# Patient Record
Sex: Female | Born: 1985 | Race: Black or African American | Hispanic: No | Marital: Single | Smoking: Current some day smoker
Health system: Southern US, Community
[De-identification: ages and names within clinical notes are randomized; demographics above are authoritative.]

## PROBLEM LIST (undated history)

## (undated) DIAGNOSIS — L309 Dermatitis, unspecified: Secondary | ICD-10-CM

## (undated) DIAGNOSIS — E7800 Pure hypercholesterolemia, unspecified: Secondary | ICD-10-CM

## (undated) DIAGNOSIS — G43909 Migraine, unspecified, not intractable, without status migrainosus: Secondary | ICD-10-CM

## (undated) DIAGNOSIS — J45909 Unspecified asthma, uncomplicated: Secondary | ICD-10-CM

## (undated) DIAGNOSIS — K219 Gastro-esophageal reflux disease without esophagitis: Secondary | ICD-10-CM

## (undated) DIAGNOSIS — F419 Anxiety disorder, unspecified: Secondary | ICD-10-CM

## (undated) DIAGNOSIS — K449 Diaphragmatic hernia without obstruction or gangrene: Secondary | ICD-10-CM

## (undated) HISTORY — PX: CHOLECYSTECTOMY: SHX55

## (undated) HISTORY — PX: ABDOMINAL HYSTERECTOMY: SHX81

## (undated) HISTORY — PX: TONSILLECTOMY: SUR1361

---

## 2012-06-20 ENCOUNTER — Encounter (HOSPITAL_BASED_OUTPATIENT_CLINIC_OR_DEPARTMENT_OTHER): Payer: Self-pay | Admitting: *Deleted

## 2012-06-20 ENCOUNTER — Emergency Department (HOSPITAL_BASED_OUTPATIENT_CLINIC_OR_DEPARTMENT_OTHER)
Admission: EM | Admit: 2012-06-20 | Discharge: 2012-06-20 | Disposition: A | Discharge status: Home / Self Care | Payer: Medicaid Other | Attending: Emergency Medicine | Admitting: Emergency Medicine

## 2012-06-20 DIAGNOSIS — A499 Bacterial infection, unspecified: Secondary | ICD-10-CM | POA: Insufficient documentation

## 2012-06-20 DIAGNOSIS — L309 Dermatitis, unspecified: Secondary | ICD-10-CM

## 2012-06-20 DIAGNOSIS — N760 Acute vaginitis: Secondary | ICD-10-CM

## 2012-06-20 DIAGNOSIS — L259 Unspecified contact dermatitis, unspecified cause: Secondary | ICD-10-CM | POA: Insufficient documentation

## 2012-06-20 DIAGNOSIS — B9689 Other specified bacterial agents as the cause of diseases classified elsewhere: Secondary | ICD-10-CM | POA: Insufficient documentation

## 2012-06-20 HISTORY — DX: Dermatitis, unspecified: L30.9

## 2012-06-20 LAB — WET PREP, GENITAL: Trich, Wet Prep: NONE SEEN

## 2012-06-20 LAB — URINALYSIS, ROUTINE W REFLEX MICROSCOPIC
Bilirubin Urine: NEGATIVE
Glucose, UA: NEGATIVE mg/dL
Hgb urine dipstick: NEGATIVE
Ketones, ur: NEGATIVE mg/dL
Protein, ur: NEGATIVE mg/dL
pH: 7 (ref 5.0–8.0)

## 2012-06-20 MED ORDER — DEXAMETHASONE SODIUM PHOSPHATE 4 MG/ML IJ SOLN
4.0000 mg | Freq: Once | INTRAMUSCULAR | Status: AC
  Administered 2012-06-20: 4 mg via INTRAMUSCULAR
  Filled 2012-06-20: qty 1

## 2012-06-20 MED ORDER — METRONIDAZOLE 500 MG PO TABS
500.0000 mg | ORAL_TABLET | Freq: Once | ORAL | Status: AC
  Administered 2012-06-20: 500 mg via ORAL
  Filled 2012-06-20: qty 1

## 2012-06-20 MED ORDER — METRONIDAZOLE 500 MG PO TABS: 500.0000 mg | ORAL_TABLET | Freq: Two times a day (BID) | ORAL | Status: AC

## 2012-06-20 MED ORDER — HYDROXYZINE HCL 25 MG PO TABS
25.0000 mg | ORAL_TABLET | Freq: Once | ORAL | Status: AC
  Administered 2012-06-20: 25 mg via ORAL
  Filled 2012-06-20: qty 1

## 2012-06-20 NOTE — ED Provider Notes (Addendum)
I saw and evaluated the patient, reviewed the resident's note and I agree with the findings and plan.  RRR, CTAB. See Resident note for full pelvic exam.  Forbes Cellar, MD 06/20/12 1533  Forbes Cellar, MD 06/20/12 [phone]

## 2012-06-20 NOTE — ED Provider Notes (Signed)
History     CSN: [id number]  Arrival date & time 06/20/12  4098   None     Chief Complaint  Patient presents with  . Rash  . Vaginal Itching    (Consider location/radiation/quality/duration/timing/severity/associated sxs/prior treatment) HPI  26 year old female who presents with a severe itching over her entire body. She believes that it is the results of an eczema flare and claims that she usually has an injection of cortisone once a year from her dermatologist. She has not been able to see her dermatologist , because has has been out of town. She also notes that she has vaginal burning without discharge and is concerned about a yeast infection versus bacterial vaginosis.  She denies fever and abdominal pain.   Past Medical History  Diagnosis Date  . Eczema     History reviewed. No pertinent past surgical history.  History reviewed. No pertinent family history.  History  Substance Use Topics  . Smoking status: Never Smoker   . Smokeless tobacco: Not on file  . Alcohol Use: Yes     occ    OB History    Grav Para Term Preterm Abortions TAB SAB Ect Mult Living                  Review of Systems  Constitutional: Negative.   Eyes: Negative.   Respiratory: Negative.   Cardiovascular: Negative.   Gastrointestinal: Negative.   Genitourinary: Positive for dysuria and vaginal pain. Negative for hematuria, flank pain, vaginal discharge and pelvic pain.  Musculoskeletal: Negative.   Neurological: Positive for headaches.  Hematological: Negative.     Allergies  Review of patient's allergies indicates no known allergies.  Home Medications   Current Outpatient Rx  Name Route Sig Dispense Refill  . METRONIDAZOLE 500 MG PO TABS Oral Take 1 tablet (500 mg total) by mouth 2 (two) times daily. 14 tablet 0    BP 122/83  Pulse 89  Temp 98.8 F (37.1 C) (Oral)  SpO2 100%  Physical Exam  Constitutional: She is oriented to person, place, and time. She appears  well-developed and well-nourished. No distress.       Obese female persistently scratching her trunk and extremities throughout the exam   Eyes: Conjunctivae and EOM are normal. Pupils are equal, round, and reactive to light. No scleral icterus.  Fundoscopic exam:      The right eye shows no papilledema.       The left eye shows no papilledema.  Cardiovascular: Normal rate and regular rhythm.   Pulmonary/Chest: Effort normal and breath sounds normal.  Genitourinary: Vagina normal. There is no rash or tenderness on the right labia. There is no rash or tenderness on the left labia. Cervix exhibits no motion tenderness and no discharge.  Neurological: She is alert and oriented to person, place, and time. No cranial nerve deficit or sensory deficit.  Skin: No abrasion noted. She is not diaphoretic.       Dry skin on her extremities with numerous small pale papules in the flexor surfaces of her extremities in a generalized distribution     ED Course  Procedures (including critical care time)  Labs Reviewed  WET PREP, GENITAL - Abnormal; Notable for the following:    Clue Cells Wet Prep HPF POC TOO NUMEROUS TO COUNT (*)     WBC, Wet Prep HPF POC FEW (*)     All other components within normal limits  URINALYSIS, ROUTINE W REFLEX MICROSCOPIC  PREGNANCY, URINE  GC/CHLAMYDIA PROBE AMP, GENITAL   No results found.   1. Bacterial vaginosis   2. Eczema       MDM  Mrs. Tucciarone has generalized pruritis that is most likely from atopic dermatitis. Given the severity of itching and the previous relief from corticosteroids, she was given an IM injection of 4mg  of dexamethasone. For the bacterial vaginosis, she was prescribed metronidazole. Her headache is a primary tension type headache that did not require any intervention. The patient was stable for discharge and agreeable with this plan.          Garnetta Buddy, MD 06/20/12 [phone]

## 2012-06-20 NOTE — ED Notes (Signed)
Itching all over has history of eczema also reports using new soap that she thinks irritated "everything" also thinks she has yeast infection

## 2012-06-21 LAB — GC/CHLAMYDIA PROBE AMP, GENITAL: Chlamydia, DNA Probe: NEGATIVE

## 2013-01-05 ENCOUNTER — Encounter (HOSPITAL_BASED_OUTPATIENT_CLINIC_OR_DEPARTMENT_OTHER): Payer: Self-pay | Admitting: *Deleted

## 2013-01-05 ENCOUNTER — Emergency Department (HOSPITAL_BASED_OUTPATIENT_CLINIC_OR_DEPARTMENT_OTHER)
Admission: EM | Admit: 2013-01-05 | Discharge: 2013-01-05 | Disposition: A | Discharge status: Home / Self Care | Payer: Medicaid Other | Attending: Emergency Medicine | Admitting: Emergency Medicine

## 2013-01-05 DIAGNOSIS — R21 Rash and other nonspecific skin eruption: Secondary | ICD-10-CM | POA: Insufficient documentation

## 2013-01-05 DIAGNOSIS — E119 Type 2 diabetes mellitus without complications: Secondary | ICD-10-CM | POA: Insufficient documentation

## 2013-01-05 DIAGNOSIS — R51 Headache: Secondary | ICD-10-CM | POA: Insufficient documentation

## 2013-01-05 DIAGNOSIS — L259 Unspecified contact dermatitis, unspecified cause: Secondary | ICD-10-CM | POA: Insufficient documentation

## 2013-01-05 DIAGNOSIS — Z8719 Personal history of other diseases of the digestive system: Secondary | ICD-10-CM | POA: Insufficient documentation

## 2013-01-05 DIAGNOSIS — R110 Nausea: Secondary | ICD-10-CM | POA: Insufficient documentation

## 2013-01-05 LAB — GLUCOSE, CAPILLARY: Glucose-Capillary: 116 mg/dL — ABNORMAL HIGH (ref 70–99)

## 2013-01-05 MED ORDER — CLOTRIMAZOLE 1 % EX CREA: TOPICAL_CREAM | CUTANEOUS | Status: DC

## 2013-01-05 NOTE — ED Notes (Signed)
Rash under breasts x 3 weeks. Seen at Urgent Care 2 weeks ago. Dx'd with eczema. Multiple family members are diabetics, but pt has not been tested for same.

## 2013-01-05 NOTE — ED Provider Notes (Signed)
History  This chart was scribed for Shelda Jakes, MD by Erskine Emery, ED Scribe. This patient was seen in room MH09/MH09 and the patient's care was started at 18:06.   CSN: [id number]  Arrival date & time 01/05/13  1802   First MD Initiated Contact with Patient 01/05/13 1806      Chief Complaint  Patient presents with  . Rash    (Consider location/radiation/quality/duration/timing/severity/associated sxs/prior treatment) The history is provided by the patient. No language interpreter was used.  Rita Dickson is a 27 y.o. female who presents to the Emergency Department complaining of a rash under her breasts bilaterally for the past 3 weeks. Pt reports some baseline nausea and mild headache, but denies any associated drainage from the rash, fever, cough, congestion, sore throat, rhinorrhea, chest pain, shortness of breath, abdominal pain, emesis, diarrhea, dysuria, hematuria, vision changes, eczema outbreaks in other places, or h/o bleeding easily. Pt was seen at Urgent Care 2 weeks ago for the same complaint. They thought it was eczema and was told her to take the hydrocortisone cream she already had, which did nothing to relieve the symptoms. Pt does not think the rash is eczema related, based on her experience with previous outbreaks. Pt claims she recently changed her detergent. Pt went to a physician to be evaluated for allergies 2 weeks ago and was found to be only allergic to mold. He did prescribe her another form of cream for her rash that she has not yet tried. Pt has a h/o eczema, DM, and hiatal hernia. Pt has NKDA.   Past Medical History  Diagnosis Date  . Eczema     History reviewed. No pertinent past surgical history.  History reviewed. No pertinent family history.  History  Substance Use Topics  . Smoking status: Never Smoker   . Smokeless tobacco: Not on file  . Alcohol Use: Yes     Comment: occ    OB History    Grav Para Term Preterm Abortions TAB SAB  Ect Mult Living                  Review of Systems  Constitutional: Negative for fever and chills.  HENT: Negative for congestion, rhinorrhea and neck pain.   Eyes: Negative for visual disturbance.  Respiratory: Negative for cough and shortness of breath.   Cardiovascular: Negative for chest pain.  Gastrointestinal: Positive for nausea. Negative for vomiting, abdominal pain and diarrhea.  Genitourinary: Negative for dysuria.  Musculoskeletal: Negative for back pain.  Skin: Positive for rash.  Neurological: Positive for headaches.  Hematological: Does not bruise/bleed easily.  Psychiatric/Behavioral: Negative for sleep disturbance.  All other systems reviewed and are negative.    Allergies  Review of patient's allergies indicates no known allergies.  Home Medications   Current Outpatient Rx  Name  Route  Sig  Dispense  Refill  . CLOTRIMAZOLE 1 % EX CREA      Apply to affected area 2 times daily   15 g   2     Triage Vitals: BP 134/81  Pulse 86  Temp 98.4 F (36.9 C) (Oral)  Resp 20  Ht 5\' 9"  (1.753 m)  Wt 274 lb (124.286 kg)  BMI 40.46 kg/m2  SpO2 100%  LMP 12/25/2012  Physical Exam  Nursing note and vitals reviewed. Constitutional: She is oriented to person, place, and time. She appears well-developed and well-nourished. No distress.  HENT:  Head: Normocephalic and atraumatic.  Eyes: EOM are normal. Pupils are equal,  round, and reactive to light.       Sclera are clear.  Neck: Neck supple. No tracheal deviation present.  Cardiovascular: Normal rate, regular rhythm and normal heart sounds.   No murmur heard. Pulmonary/Chest: Effort normal and breath sounds normal. No respiratory distress. She has no wheezes.  Abdominal: Soft. Bowel sounds are normal. She exhibits no distension. There is no tenderness.  Musculoskeletal: Normal range of motion. She exhibits no edema.       No swelling in the legs.  Lymphadenopathy:    She has no cervical adenopathy.   Neurological: She is alert and oriented to person, place, and time. No cranial nerve deficit. Coordination normal.  Skin: Skin is warm and dry. Rash noted.  Psychiatric: She has a normal mood and affect.    ED Course  Procedures (including critical care time) DIAGNOSTIC STUDIES: Oxygen Saturation is 100% on room air, normal by my interpretation.    COORDINATION OF CARE: 18:30--I evaluated the patient and we discussed a treatment plan including antifungal cream for the next 1-2 weeks to which the pt agreed.    Labs Reviewed  GLUCOSE, CAPILLARY - Abnormal; Notable for the following:    Glucose-Capillary 116 (*)     All other components within normal limits   No results found.   1. Rash       MDM   Patient with mild rash under both breasts. May be fungal patient does have a history of eczema could be mild eczema or could be a contact dermatitis related to her bras. Patient's already had a trial of hydrocortisone cream without any significant change we'll give a trial of Lotrimin to rule out fungal infection. If he gets worse with Lotrimin patient knows to stop this would imply that the hydrocortisone event happening has recently been given a cream by her allergist to try would recommend using that at that point in time. Patient also had concerned about having diabetes blood glucose here is 116 is not consistent with diabetes.      I personally performed the services described in this documentation, which was scribed in my presence. The recorded information has been reviewed and is accurate.     Shelda Jakes, MD 01/05/13 [phone]

## 2013-01-13 ENCOUNTER — Encounter (HOSPITAL_BASED_OUTPATIENT_CLINIC_OR_DEPARTMENT_OTHER): Payer: Self-pay | Admitting: *Deleted

## 2013-01-13 ENCOUNTER — Emergency Department (HOSPITAL_BASED_OUTPATIENT_CLINIC_OR_DEPARTMENT_OTHER)
Admission: EM | Admit: 2013-01-13 | Discharge: 2013-01-14 | Disposition: A | Discharge status: Home / Self Care | Payer: Medicaid Other | Attending: Emergency Medicine | Admitting: Emergency Medicine

## 2013-01-13 DIAGNOSIS — Z79899 Other long term (current) drug therapy: Secondary | ICD-10-CM | POA: Insufficient documentation

## 2013-01-13 DIAGNOSIS — N390 Urinary tract infection, site not specified: Secondary | ICD-10-CM

## 2013-01-13 DIAGNOSIS — K219 Gastro-esophageal reflux disease without esophagitis: Secondary | ICD-10-CM | POA: Insufficient documentation

## 2013-01-13 DIAGNOSIS — Z3202 Encounter for pregnancy test, result negative: Secondary | ICD-10-CM | POA: Insufficient documentation

## 2013-01-13 DIAGNOSIS — L259 Unspecified contact dermatitis, unspecified cause: Secondary | ICD-10-CM | POA: Insufficient documentation

## 2013-01-13 DIAGNOSIS — R110 Nausea: Secondary | ICD-10-CM | POA: Insufficient documentation

## 2013-01-13 LAB — CBC WITH DIFFERENTIAL/PLATELET
Basophils Absolute: 0 10*3/uL (ref 0.0–0.1)
Eosinophils Relative: 1 % (ref 0–5)
Lymphocytes Relative: 41 % (ref 12–46)
Lymphs Abs: 3.1 10*3/uL (ref 0.7–4.0)
MCV: 86.1 fL (ref 78.0–100.0)
Neutro Abs: 4.1 10*3/uL (ref 1.7–7.7)
Platelets: 254 10*3/uL (ref 150–400)
RBC: 4.52 MIL/uL (ref 3.87–5.11)
RDW: 12.7 % (ref 11.5–15.5)
WBC: 7.5 10*3/uL (ref 4.0–10.5)

## 2013-01-13 LAB — URINALYSIS, ROUTINE W REFLEX MICROSCOPIC
Ketones, ur: NEGATIVE mg/dL
Nitrite: NEGATIVE
Specific Gravity, Urine: 1.023 (ref 1.005–1.030)
Urobilinogen, UA: 0.2 mg/dL (ref 0.0–1.0)
pH: 6 (ref 5.0–8.0)

## 2013-01-13 LAB — COMPREHENSIVE METABOLIC PANEL
ALT: 11 U/L (ref 0–35)
AST: 17 U/L (ref 0–37)
Alkaline Phosphatase: 77 U/L (ref 39–117)
CO2: 26 mEq/L (ref 19–32)
Chloride: 103 mEq/L (ref 96–112)
GFR calc Af Amer: 89 mL/min — ABNORMAL LOW (ref >90)
GFR calc non Af Amer: 76 mL/min — ABNORMAL LOW (ref >90)
Glucose, Bld: 118 mg/dL — ABNORMAL HIGH (ref 70–99)
Potassium: 4.2 mEq/L (ref 3.5–5.1)
Sodium: 138 mEq/L (ref 135–145)
Total Bilirubin: 0.2 mg/dL — ABNORMAL LOW (ref 0.3–1.2)

## 2013-01-13 LAB — URINE MICROSCOPIC-ADD ON

## 2013-01-13 MED ORDER — GI COCKTAIL ~~LOC~~
30.0000 mL | Freq: Once | ORAL | Status: AC
  Administered 2013-01-13: 30 mL via ORAL
  Filled 2013-01-13: qty 30

## 2013-01-13 MED ORDER — TRAMADOL HCL 50 MG PO TABS
50.0000 mg | ORAL_TABLET | Freq: Once | ORAL | Status: AC
  Administered 2013-01-13: 50 mg via ORAL
  Filled 2013-01-13: qty 1

## 2013-01-13 NOTE — ED Notes (Signed)
Pt c/o abd pain x 2 days 

## 2013-01-13 NOTE — ED Notes (Signed)
MD at bedside. 

## 2013-01-13 NOTE — ED Provider Notes (Signed)
History    This chart was scribed for Rita Males Smitty Cords, MD by Donne Anon, ED Scribe. This patient was seen in room MH12/MH12 and the patient's care was started at 2303.   CSN: [id number]  Arrival date & time 01/13/13  2237   First MD Initiated Contact with Patient 01/13/13 2303      Chief Complaint  Patient presents with  . Abdominal Pain     Patient is a 27 y.o. female presenting with abdominal pain. The history is provided by the patient. No language interpreter was used.  Abdominal Pain Pain location:  Generalized Pain quality: sharp   Pain radiates to:  Does not radiate Pain severity:  Moderate Onset quality:  Gradual Duration:  2 days Timing:  Constant Progression:  Unchanged Chronicity:  New Context: not suspicious food intake and not trauma   Relieved by:  None tried Worsened by:  Nothing tried Ineffective treatments:  None tried Associated symptoms: nausea   Associated symptoms: no constipation, no diarrhea and no vomiting    Past Medical History  Diagnosis Date  . Eczema     History reviewed. No pertinent past surgical history.  History reviewed. No pertinent family history.  History  Substance Use Topics  . Smoking status: Never Smoker   . Smokeless tobacco: Not on file  . Alcohol Use: Yes     Comment: occ     Review of Systems  Gastrointestinal: Positive for nausea and abdominal pain. Negative for vomiting, diarrhea and constipation.  Genitourinary: Negative for urgency, frequency and pelvic pain.  All other systems reviewed and are negative.    Allergies  Review of patient's allergies indicates no known allergies.  Home Medications   Current Outpatient Rx  Name  Route  Sig  Dispense  Refill  . clotrimazole (LOTRIMIN) 1 % cream      Apply to affected area 2 times daily   15 g   2     BP 139/86  Pulse 83  Temp(Src) 98.7 F (37.1 C) (Oral)  Ht 5\' 9"  (1.753 m)  Wt 274 lb (124.286 kg)  BMI 40.44 kg/m2  SpO2 100%  LMP  12/25/2012  Physical Exam  Nursing note and vitals reviewed. Constitutional: She is oriented to person, place, and time. She appears well-developed and well-nourished. No distress.  HENT:  Head: Normocephalic.  Mouth/Throat: Oropharynx is clear and moist.  Eyes: Pupils are equal, round, and reactive to light.  Neck: Normal range of motion. Neck supple.  Cardiovascular: Normal rate, regular rhythm and normal heart sounds.   Pulmonary/Chest: Effort normal and breath sounds normal. No respiratory distress.  Abdominal: Soft. She exhibits no distension. There is no tenderness. There is no rebound and no guarding.  Hyperactive bowel sounds.   Musculoskeletal: Normal range of motion.  Neurological: She is alert and oriented to person, place, and time.  Skin: Skin is warm.  Psychiatric: She has a normal mood and affect. Her behavior is normal.    ED Course  Procedures (including critical care time) DIAGNOSTIC STUDIES: Oxygen Saturation is 100% on room air, normal by my interpretation.    COORDINATION OF CARE: 11:13 PM Discussed treatment plan which includes medication with pt at bedside and pt agreed to plan.     Labs Reviewed  URINALYSIS, ROUTINE W REFLEX MICROSCOPIC - Abnormal; Notable for the following:    Hgb urine dipstick LARGE (*)    Bilirubin Urine MODERATE (*)    All other components within normal limits  URINE MICROSCOPIC-ADD ON -  Abnormal; Notable for the following:    Squamous Epithelial / LPF MANY (*)    Bacteria, UA MANY (*)    All other components within normal limits  PREGNANCY, URINE   No results found.   No diagnosis found.    MDM  Bland diet.  Exam labs reassuring.  Follow up with your GI specialist for ongoing care.  Will treat for UTI I personally performed the services described in this documentation, which was scribed in my presence. The recorded information has been reviewed and is accurate.          Jasmine Awe, MD 01/14/13  [phone]

## 2013-01-14 ENCOUNTER — Emergency Department (HOSPITAL_BASED_OUTPATIENT_CLINIC_OR_DEPARTMENT_OTHER): Payer: Medicaid Other

## 2013-01-14 MED ORDER — CIPROFLOXACIN HCL 500 MG PO TABS: 500.0000 mg | ORAL_TABLET | Freq: Two times a day (BID) | ORAL | Status: DC

## 2013-01-14 NOTE — ED Notes (Signed)
Patient transported to CT 

## 2013-03-20 ENCOUNTER — Encounter (HOSPITAL_BASED_OUTPATIENT_CLINIC_OR_DEPARTMENT_OTHER): Payer: Self-pay | Admitting: Family Medicine

## 2013-03-20 ENCOUNTER — Emergency Department (HOSPITAL_BASED_OUTPATIENT_CLINIC_OR_DEPARTMENT_OTHER)
Admission: EM | Admit: 2013-03-20 | Discharge: 2013-03-20 | Disposition: A | Discharge status: Home / Self Care | Payer: Medicaid Other | Attending: Emergency Medicine | Admitting: Emergency Medicine

## 2013-03-20 DIAGNOSIS — IMO0002 Reserved for concepts with insufficient information to code with codable children: Secondary | ICD-10-CM | POA: Insufficient documentation

## 2013-03-20 DIAGNOSIS — K219 Gastro-esophageal reflux disease without esophagitis: Secondary | ICD-10-CM | POA: Insufficient documentation

## 2013-03-20 DIAGNOSIS — Z79899 Other long term (current) drug therapy: Secondary | ICD-10-CM | POA: Insufficient documentation

## 2013-03-20 DIAGNOSIS — Z872 Personal history of diseases of the skin and subcutaneous tissue: Secondary | ICD-10-CM | POA: Insufficient documentation

## 2013-03-20 DIAGNOSIS — H6093 Unspecified otitis externa, bilateral: Secondary | ICD-10-CM

## 2013-03-20 DIAGNOSIS — H60399 Other infective otitis externa, unspecified ear: Secondary | ICD-10-CM | POA: Insufficient documentation

## 2013-03-20 HISTORY — DX: Gastro-esophageal reflux disease without esophagitis: K21.9

## 2013-03-20 MED ORDER — IBUPROFEN 800 MG PO TABS
800.0000 mg | ORAL_TABLET | Freq: Once | ORAL | Status: AC
  Administered 2013-03-20: 800 mg via ORAL
  Filled 2013-03-20: qty 1

## 2013-03-20 MED ORDER — ANTIPYRINE-BENZOCAINE 5.4-1.4 % OT SOLN
3.0000 [drp] | Freq: Once | OTIC | Status: AC
  Administered 2013-03-20: 3 [drp] via OTIC
  Filled 2013-03-20: qty 10

## 2013-03-20 MED ORDER — CIPROFLOXACIN-DEXAMETHASONE 0.3-0.1 % OT SUSP
1.0000 [drp] | Freq: Once | OTIC | Status: AC
  Administered 2013-03-20: 1 [drp] via OTIC
  Filled 2013-03-20: qty 7.5

## 2013-03-20 NOTE — ED Provider Notes (Signed)
History     CSN: [id number]  Arrival date & time 03/20/13  0712   First MD Initiated Contact with Patient 03/20/13 [phone]      Chief Complaint  Patient presents with  . Otalgia    (Consider location/radiation/quality/duration/timing/severity/associated sxs/prior treatment) The history is provided by the patient.  Rita Dickson is a 27 y.o. female history of otitis media when she was a child here presenting with bilateral ear pain. She had R sided ear pain 2 days ago that resolved and last night she had left-sided ear pain. She also noticed some ringing in her ears yesterday. No fevers or chills. Had frequent otitis media as a child but not since age 44. She also has some allergy symptoms currently and is taking allegra. She doesn't swim or do water sports.    Past Medical History  Diagnosis Date  . Eczema   . GERD (gastroesophageal reflux disease)     Past Surgical History  Procedure Laterality Date  . Tonsillectomy    . Cholecystectomy      No family history on file.  History  Substance Use Topics  . Smoking status: Never Smoker   . Smokeless tobacco: Not on file  . Alcohol Use: Yes     Comment: occ    OB History   Grav Para Term Preterm Abortions TAB SAB Ect Mult Living                  Review of Systems  HENT: Positive for ear pain.   All other systems reviewed and are negative.    Allergies  Review of patient's allergies indicates no known allergies.  Home Medications   Current Outpatient Rx  Name  Route  Sig  Dispense  Refill  . cyclobenzaprine (FLEXERIL) 10 MG tablet   Oral   Take 10 mg by mouth 3 (three) times daily as needed for muscle spasms.         . Esomeprazole Magnesium (NEXIUM PO)   Oral   Take by mouth.         . ETODOLAC PO   Oral   Take by mouth.         . Fexofenadine HCl (ALLEGRA PO)   Oral   Take by mouth.         . fluticasone (FLONASE) 50 MCG/ACT nasal spray   Nasal   Place 2 sprays into the nose daily.        . ciprofloxacin (CIPRO) 500 MG tablet   Oral   Take 1 tablet (500 mg total) by mouth 2 (two) times daily. One po bid x 7 days   14 tablet   0   . clotrimazole (LOTRIMIN) 1 % cream      Apply to affected area 2 times daily   15 g   2     BP 135/88  Pulse 79  Temp(Src) 98.3 F (36.8 C) (Oral)  Resp 16  SpO2 100%  LMP 03/11/2013  Physical Exam  Nursing note and vitals reviewed. Constitutional: She is oriented to person, place, and time. She appears well-developed and well-nourished.  HENT:  Head: Normocephalic.  Mouth/Throat: Oropharynx is clear and moist.  R ear Dickson red with some debris. L ear Dickson slightly red with minimal debris. TM bilaterally clear with no effusions. No mastoid tenderness   Eyes: Conjunctivae are normal. Pupils are equal, round, and reactive to light.  Neck: Normal range of motion. Neck supple.  Cardiovascular: Normal rate.   Pulmonary/Chest: Effort  normal and breath sounds normal. No respiratory distress.  Abdominal: Soft. Bowel sounds are normal. She exhibits no distension. There is no tenderness. There is no rebound.  Musculoskeletal: Normal range of motion.  Neurological: She is alert and oriented to person, place, and time.  Skin: Skin is warm and dry.  Psychiatric: She has a normal mood and affect. Her behavior is normal. Judgment and thought content normal.    ED Course  Procedures (including critical care time)  Labs Reviewed - No data to display No results found.   No diagnosis found.    MDM  Rita Dickson is a 27 y.o. female here with likely early otitis externa vs outer ear irritation. Patient given ciprodex and auralgan and motrin in the ED. Will d/c home on same. Not concerned about mastoiditis or malignant otitis externa. Return precautions given.          Rita Canal, MD 03/20/13 [phone]

## 2013-03-20 NOTE — ED Notes (Signed)
Pt c/o left ear pain x 1 day with h/o ear infections.

## 2013-03-27 ENCOUNTER — Encounter (HOSPITAL_BASED_OUTPATIENT_CLINIC_OR_DEPARTMENT_OTHER): Payer: Self-pay

## 2013-03-27 ENCOUNTER — Emergency Department (HOSPITAL_BASED_OUTPATIENT_CLINIC_OR_DEPARTMENT_OTHER): Payer: Medicaid Other

## 2013-03-27 ENCOUNTER — Emergency Department (HOSPITAL_BASED_OUTPATIENT_CLINIC_OR_DEPARTMENT_OTHER)
Admission: EM | Admit: 2013-03-27 | Discharge: 2013-03-27 | Disposition: A | Discharge status: Home / Self Care | Payer: Medicaid Other | Attending: Emergency Medicine | Admitting: Emergency Medicine

## 2013-03-27 DIAGNOSIS — Z79899 Other long term (current) drug therapy: Secondary | ICD-10-CM | POA: Insufficient documentation

## 2013-03-27 DIAGNOSIS — G8929 Other chronic pain: Secondary | ICD-10-CM | POA: Insufficient documentation

## 2013-03-27 DIAGNOSIS — J45909 Unspecified asthma, uncomplicated: Secondary | ICD-10-CM | POA: Insufficient documentation

## 2013-03-27 DIAGNOSIS — M25569 Pain in unspecified knee: Secondary | ICD-10-CM | POA: Insufficient documentation

## 2013-03-27 DIAGNOSIS — M25562 Pain in left knee: Secondary | ICD-10-CM

## 2013-03-27 DIAGNOSIS — IMO0002 Reserved for concepts with insufficient information to code with codable children: Secondary | ICD-10-CM | POA: Insufficient documentation

## 2013-03-27 DIAGNOSIS — Z872 Personal history of diseases of the skin and subcutaneous tissue: Secondary | ICD-10-CM | POA: Insufficient documentation

## 2013-03-27 DIAGNOSIS — K219 Gastro-esophageal reflux disease without esophagitis: Secondary | ICD-10-CM | POA: Insufficient documentation

## 2013-03-27 HISTORY — DX: Unspecified asthma, uncomplicated: J45.909

## 2013-03-27 MED ORDER — MELOXICAM 15 MG PO TABS: 15.0000 mg | ORAL_TABLET | Freq: Every day | ORAL | Status: DC

## 2013-03-27 NOTE — ED Notes (Signed)
Left knee pain and swelling x 1 month.

## 2013-03-27 NOTE — ED Notes (Signed)
Patient transported to X-ray 

## 2013-03-27 NOTE — ED Provider Notes (Signed)
History     CSN: [id number]  Arrival date & time 03/27/13  4098   First MD Initiated Contact with Patient 03/27/13 [phone]      Chief Complaint  Patient presents with  . Knee Pain    (Consider location/radiation/quality/duration/timing/severity/associated sxs/prior treatment) Patient is a 27 y.o. female presenting with knee pain.  Knee Pain  Pt reports gradually worsening left knee pain for the last year, noticeably worse in the last month, associated with swelling, worse going up and down steps. She has not seen her doctor or taken any medications although she does take etodolac BID for chronic back pain. No injury. She was here for ear pain a week ago but did not mention knee pain then. She is concerned about blood clots.   Past Medical History  Diagnosis Date  . Eczema   . GERD (gastroesophageal reflux disease)   . Asthma     Past Surgical History  Procedure Laterality Date  . Tonsillectomy    . Cholecystectomy      No family history on file.  History  Substance Use Topics  . Smoking status: Never Smoker   . Smokeless tobacco: Not on file  . Alcohol Use: Yes     Comment: occ    OB History   Grav Para Term Preterm Abortions TAB SAB Ect Mult Living                  Review of Systems All other systems reviewed and are negative except as noted in HPI.   Allergies  Review of patient's allergies indicates no known allergies.  Home Medications   Current Outpatient Rx  Name  Route  Sig  Dispense  Refill  . ALBUTEROL SULFATE HFA IN   Inhalation   Inhale into the lungs.         Marland Kitchen etodolac (LODINE) 400 MG tablet   Oral   Take 400 mg by mouth 2 (two) times daily.         . Esomeprazole Magnesium (NEXIUM PO)   Oral   Take by mouth.         . Fexofenadine HCl (ALLEGRA PO)   Oral   Take by mouth.         . fluticasone (FLONASE) 50 MCG/ACT nasal spray   Nasal   Place 2 sprays into the nose daily.           BP 142/85  Pulse 103  Temp(Src)  98.4 F (36.9 C) (Oral)  Resp 16  Ht 5\' 9"  (1.753 m)  Wt 280 lb (127.007 kg)  BMI 41.33 kg/m2  SpO2 100%  LMP 03/11/2013  Physical Exam  Nursing note and vitals reviewed. Constitutional: She is oriented to person, place, and time. She appears well-developed and well-nourished.  HENT:  Head: Normocephalic and atraumatic.  Eyes: EOM are normal. Pupils are equal, round, and reactive to light.  Neck: Normal range of motion. Neck supple.  Cardiovascular: Normal rate, normal heart sounds and intact distal pulses.   Pulmonary/Chest: Effort normal and breath sounds normal.  Abdominal: Bowel sounds are normal. She exhibits no distension. There is no tenderness.  Musculoskeletal: Normal range of motion. She exhibits tenderness (mild tenderness to palpation medial left knee; possible mild joint effusion). She exhibits no edema.  Pt reports she sees swelling to L knee and thigh, but I do not appreciate any asymmetry   Neurological: She is alert and oriented to person, place, and time. She has normal strength. No cranial nerve deficit  or sensory deficit.  Skin: Skin is warm and dry. No rash noted.  Psychiatric: She has a normal mood and affect.    ED Course  Procedures (including critical care time)  Labs Reviewed - No data to display Dg Knee Complete 4 Views Left  03/27/2013  *RADIOLOGY REPORT*  Clinical Data: Pain and swelling  LEFT KNEE - COMPLETE 4+ VIEW  Comparison: None.  Findings:  Frontal, lateral, and bilateral oblique views were obtained.  There is no fracture, dislocation, or effusion.  Joint spaces appear intact.  No erosive change.  IMPRESSION: No abnormality noted.   Original Report Authenticated By: Bretta Bang, M.D.      1. Left knee pain       MDM  Normal xray. Likely a bursitis, no concern for DVT. Pt given knee sleeve, switch NSAID from etodolac to meloxicam. Ortho followup.         Charles B. Bernette Mayers, MD 03/27/13 1019

## 2013-05-04 ENCOUNTER — Emergency Department (HOSPITAL_BASED_OUTPATIENT_CLINIC_OR_DEPARTMENT_OTHER)
Admission: EM | Admit: 2013-05-04 | Discharge: 2013-05-05 | Discharge status: Against Medical Advice | Payer: Medicaid Other | Attending: Emergency Medicine | Admitting: Emergency Medicine

## 2013-05-04 ENCOUNTER — Encounter (HOSPITAL_BASED_OUTPATIENT_CLINIC_OR_DEPARTMENT_OTHER): Payer: Self-pay | Admitting: *Deleted

## 2013-05-04 DIAGNOSIS — H9209 Otalgia, unspecified ear: Secondary | ICD-10-CM | POA: Insufficient documentation

## 2013-05-04 DIAGNOSIS — J45909 Unspecified asthma, uncomplicated: Secondary | ICD-10-CM | POA: Insufficient documentation

## 2013-05-04 NOTE — ED Notes (Signed)
C/o bilateral ear pain that stated yesterday but worse tonight. Denies any fevers or any other symptoms.

## 2013-05-05 NOTE — ED Provider Notes (Signed)
Eloped. Not seen by me.   Hanley Seamen, MD 05/05/13 [phone]

## 2013-05-10 ENCOUNTER — Encounter (HOSPITAL_BASED_OUTPATIENT_CLINIC_OR_DEPARTMENT_OTHER): Payer: Self-pay | Admitting: Emergency Medicine

## 2013-05-10 ENCOUNTER — Emergency Department (HOSPITAL_BASED_OUTPATIENT_CLINIC_OR_DEPARTMENT_OTHER)
Admission: EM | Admit: 2013-05-10 | Discharge: 2013-05-10 | Disposition: A | Discharge status: Home / Self Care | Payer: Medicaid Other | Attending: Emergency Medicine | Admitting: Emergency Medicine

## 2013-05-10 DIAGNOSIS — K219 Gastro-esophageal reflux disease without esophagitis: Secondary | ICD-10-CM | POA: Insufficient documentation

## 2013-05-10 DIAGNOSIS — M26609 Unspecified temporomandibular joint disorder, unspecified side: Secondary | ICD-10-CM

## 2013-05-10 DIAGNOSIS — J45909 Unspecified asthma, uncomplicated: Secondary | ICD-10-CM | POA: Insufficient documentation

## 2013-05-10 DIAGNOSIS — Z872 Personal history of diseases of the skin and subcutaneous tissue: Secondary | ICD-10-CM | POA: Insufficient documentation

## 2013-05-10 DIAGNOSIS — Z79899 Other long term (current) drug therapy: Secondary | ICD-10-CM | POA: Insufficient documentation

## 2013-05-10 MED ORDER — CYCLOBENZAPRINE HCL 10 MG PO TABS: ORAL_TABLET | ORAL | Status: DC

## 2013-05-10 MED ORDER — HYDROCODONE-ACETAMINOPHEN 5-325 MG PO TABS: 1.0000 | ORAL_TABLET | Freq: Four times a day (QID) | ORAL | Status: DC | PRN

## 2013-05-10 NOTE — ED Notes (Signed)
Reports jaw pain to right side when opening, mouth, talking in triage with no acute distress

## 2013-05-10 NOTE — ED Notes (Signed)
Reports left ear pain x 1 week,

## 2013-05-10 NOTE — ED Provider Notes (Signed)
History     CSN: [id number]  Arrival date & time 05/10/13  4010   First MD Initiated Contact with Patient 05/10/13 0645      Chief Complaint  Patient presents with  . Jaw Pain     (Consider location/radiation/quality/duration/timing/severity/associated sxs/prior treatment) HPI This is a 27 year old female with about a one-week history of pain in her left temporomandibular joint. It has been coming and going. It is worse when chewing or moving her mouth. The pain is moderate. It sometimes is worse at night. The pain radiates to the left ear. She believes she has a history of greater teeth in her sleep. She has a Education officer, community but has not consulted him about this. She has taken ibuprofen without adequate relief.  Past Medical History  Diagnosis Date  . Eczema   . GERD (gastroesophageal reflux disease)   . Asthma     Past Surgical History  Procedure Laterality Date  . Tonsillectomy    . Cholecystectomy      History reviewed. No pertinent family history.  History  Substance Use Topics  . Smoking status: Never Smoker   . Smokeless tobacco: Not on file  . Alcohol Use: Yes     Comment: occ    OB History   Grav Para Term Preterm Abortions TAB SAB Ect Mult Living                  Review of Systems  All other systems reviewed and are negative.    Allergies  Review of patient's allergies indicates no known allergies.  Home Medications   Current Outpatient Rx  Name  Route  Sig  Dispense  Refill  . Fexofenadine HCl (ALLEGRA PO)   Oral   Take by mouth.         . ALBUTEROL SULFATE HFA IN   Inhalation   Inhale into the lungs.         . Esomeprazole Magnesium (NEXIUM PO)   Oral   Take by mouth.         . fluticasone (FLONASE) 50 MCG/ACT nasal spray   Nasal   Place 2 sprays into the nose daily.         . meloxicam (MOBIC) 15 MG tablet   Oral   Take 1 tablet (15 mg total) by mouth daily.   30 tablet   0     BP 128/76  Pulse 78  Temp(Src) 98.6 F (37  C) (Oral)  Resp 18  SpO2 100%  LMP 04/30/2013  Physical Exam General: Well-developed, well-nourished female in no acute distress; appearance consistent with age of record HENT: normocephalic, atraumatic; tender left temporomandibular joint with clicking on jaw movement; TMs normal bilaterally Eyes: pupils equal round and reactive to light; extraocular muscles intact Neck: supple Heart: regular rate and rhythm Lungs: Normal respiratory effort and excursion Abdomen: soft; nondistended Extremities: No deformity; full range of motion Neurologic: Awake, alert and oriented; motor function intact in all extremities and symmetric; no facial droop Skin: Warm and dry Psychiatric: Normal mood and affect    ED Course  Procedures (including critical care time)     MDM  She will followup with her dentist. She was also advised that by guards are available over-the-counter and she may wish to try this route as it tends to be less expensive.        Hanley Seamen, MD 05/10/13 [phone]

## 2013-09-09 ENCOUNTER — Emergency Department (HOSPITAL_BASED_OUTPATIENT_CLINIC_OR_DEPARTMENT_OTHER)
Admission: EM | Admit: 2013-09-09 | Discharge: 2013-09-09 | Disposition: A | Discharge status: Home / Self Care | Payer: No Typology Code available for payment source | Attending: Emergency Medicine | Admitting: Emergency Medicine

## 2013-09-09 ENCOUNTER — Encounter (HOSPITAL_BASED_OUTPATIENT_CLINIC_OR_DEPARTMENT_OTHER): Payer: Self-pay | Admitting: *Deleted

## 2013-09-09 DIAGNOSIS — IMO0002 Reserved for concepts with insufficient information to code with codable children: Secondary | ICD-10-CM | POA: Insufficient documentation

## 2013-09-09 DIAGNOSIS — J45909 Unspecified asthma, uncomplicated: Secondary | ICD-10-CM | POA: Insufficient documentation

## 2013-09-09 DIAGNOSIS — Z791 Long term (current) use of non-steroidal anti-inflammatories (NSAID): Secondary | ICD-10-CM | POA: Insufficient documentation

## 2013-09-09 DIAGNOSIS — J069 Acute upper respiratory infection, unspecified: Secondary | ICD-10-CM | POA: Insufficient documentation

## 2013-09-09 DIAGNOSIS — Z79899 Other long term (current) drug therapy: Secondary | ICD-10-CM | POA: Insufficient documentation

## 2013-09-09 DIAGNOSIS — Z872 Personal history of diseases of the skin and subcutaneous tissue: Secondary | ICD-10-CM | POA: Insufficient documentation

## 2013-09-09 DIAGNOSIS — K219 Gastro-esophageal reflux disease without esophagitis: Secondary | ICD-10-CM | POA: Insufficient documentation

## 2013-09-09 DIAGNOSIS — H9209 Otalgia, unspecified ear: Secondary | ICD-10-CM | POA: Insufficient documentation

## 2013-09-09 NOTE — ED Provider Notes (Addendum)
CSN: [id number]     Arrival date & time 09/09/13  1027 History   First MD Initiated Contact with Patient 09/09/13 0701     Chief Complaint  Patient presents with  . Nasal Congestion   (Consider location/radiation/quality/duration/timing/severity/associated sxs/prior Treatment) Patient is a 27 y.o. female presenting with URI. The history is provided by the patient.  URI Presenting symptoms: congestion, cough, ear pain, rhinorrhea and sore throat   Severity:  Moderate Onset quality:  Gradual Duration:  12 hours Timing:  Constant Progression:  Worsening Chronicity:  New Relieved by:  Nothing Worsened by:  Nothing tried Ineffective treatments:  None tried Associated symptoms: no neck pain     Past Medical History  Diagnosis Date  . Eczema   . GERD (gastroesophageal reflux disease)   . Asthma    Past Surgical History  Procedure Laterality Date  . Tonsillectomy    . Cholecystectomy     No family history on file. History  Substance Use Topics  . Smoking status: Never Smoker   . Smokeless tobacco: Not on file  . Alcohol Use: Yes     Comment: occ   OB History   Grav Para Term Preterm Abortions TAB SAB Ect Mult Living                 Review of Systems  HENT: Positive for ear pain, congestion, sore throat and rhinorrhea. Negative for neck pain.   Respiratory: Positive for cough.   All other systems reviewed and are negative.    Allergies  Review of patient's allergies indicates no known allergies.  Home Medications   Current Outpatient Rx  Name  Route  Sig  Dispense  Refill  . acetaZOLAMIDE (DIAMOX) 250 MG tablet   Oral   Take 250 mg by mouth 2 (two) times daily.         . norgestrel-ethinyl estradiol (LO/OVRAL,CRYSELLE) 0.3-30 MG-MCG tablet   Oral   Take 1 tablet by mouth daily.         . traMADol (ULTRAM) 50 MG tablet   Oral   Take 50 mg by mouth every 6 (six) hours as needed for pain.         . ALBUTEROL SULFATE HFA IN   Inhalation   Inhale  into the lungs.         . cyclobenzaprine (FLEXERIL) 10 MG tablet      Take 1 tablet 3 times a day as needed for TMJ spasm.   20 tablet   0   . Esomeprazole Magnesium (NEXIUM PO)   Oral   Take by mouth.         . Fexofenadine HCl (ALLEGRA PO)   Oral   Take by mouth.         . fluticasone (FLONASE) 50 MCG/ACT nasal spray   Nasal   Place 2 sprays into the nose daily.         Marland Kitchen HYDROcodone-acetaminophen (NORCO/VICODIN) 5-325 MG per tablet   Oral   Take 1-2 tablets by mouth every 6 (six) hours as needed for pain.   20 tablet   0   . meloxicam (MOBIC) 15 MG tablet   Oral   Take 1 tablet (15 mg total) by mouth daily.   30 tablet   0    BP 123/82  Pulse 88  Temp(Src) 98.9 F (37.2 C) (Oral)  Resp 20  Ht 5\' 9"  (1.753 m)  Wt 260 lb (117.935 kg)  BMI 38.38 kg/m2  SpO2 100%  LMP 09/03/2013 Physical Exam  Nursing note and vitals reviewed. Constitutional: She is oriented to person, place, and time. She appears well-developed and well-nourished. No distress.  HENT:  Head: Normocephalic and atraumatic.  Right Ear: External ear normal.  Left Ear: External ear normal.  Mouth/Throat: Oropharynx is clear and moist.  Bilateral TMs are clear.  Neck: Normal range of motion. Neck supple.  Cardiovascular: Normal rate and regular rhythm.  Exam reveals no gallop and no friction rub.   No murmur heard. Pulmonary/Chest: Effort normal and breath sounds normal. No respiratory distress. She has no wheezes. She has no rales.  Abdominal: Soft. Bowel sounds are normal. She exhibits no distension. There is no tenderness.  Musculoskeletal: Normal range of motion. She exhibits no edema.  Lymphadenopathy:    She has no cervical adenopathy.  Neurological: She is alert and oriented to person, place, and time.  Skin: Skin is warm and dry. She is not diaphoretic.    ED Course  Procedures (including critical care time) Labs Review Labs Reviewed - No data to display Imaging Review No  results found.  MDM   1. URI (upper respiratory infection)    Symptoms almost certainly viral in nature. We'll treat with over-the-counter meds, time. Return when necessary if her symptoms worsen or change.    Geoffery Lyons, MD 09/09/13 1610  Geoffery Lyons, MD 09/09/13 [phone]

## 2013-09-09 NOTE — ED Notes (Signed)
Pt sts she began to feel bad last night with facial pressure and congestion. Pt denies fever, cough or n/v.

## 2013-09-16 ENCOUNTER — Emergency Department (HOSPITAL_BASED_OUTPATIENT_CLINIC_OR_DEPARTMENT_OTHER): Payer: No Typology Code available for payment source

## 2013-09-16 ENCOUNTER — Encounter (HOSPITAL_BASED_OUTPATIENT_CLINIC_OR_DEPARTMENT_OTHER): Payer: Self-pay | Admitting: Emergency Medicine

## 2013-09-16 ENCOUNTER — Emergency Department (HOSPITAL_BASED_OUTPATIENT_CLINIC_OR_DEPARTMENT_OTHER)
Admission: EM | Admit: 2013-09-16 | Discharge: 2013-09-16 | Disposition: A | Discharge status: Home / Self Care | Payer: No Typology Code available for payment source | Attending: Emergency Medicine | Admitting: Emergency Medicine

## 2013-09-16 DIAGNOSIS — Z872 Personal history of diseases of the skin and subcutaneous tissue: Secondary | ICD-10-CM | POA: Insufficient documentation

## 2013-09-16 DIAGNOSIS — R509 Fever, unspecified: Secondary | ICD-10-CM | POA: Insufficient documentation

## 2013-09-16 DIAGNOSIS — IMO0002 Reserved for concepts with insufficient information to code with codable children: Secondary | ICD-10-CM | POA: Insufficient documentation

## 2013-09-16 DIAGNOSIS — J45909 Unspecified asthma, uncomplicated: Secondary | ICD-10-CM | POA: Insufficient documentation

## 2013-09-16 DIAGNOSIS — J40 Bronchitis, not specified as acute or chronic: Secondary | ICD-10-CM

## 2013-09-16 DIAGNOSIS — Z79899 Other long term (current) drug therapy: Secondary | ICD-10-CM | POA: Insufficient documentation

## 2013-09-16 DIAGNOSIS — J069 Acute upper respiratory infection, unspecified: Secondary | ICD-10-CM | POA: Insufficient documentation

## 2013-09-16 DIAGNOSIS — K219 Gastro-esophageal reflux disease without esophagitis: Secondary | ICD-10-CM | POA: Insufficient documentation

## 2013-09-16 NOTE — ED Provider Notes (Addendum)
CSN: [id number]     Arrival date & time 09/16/13  2148 History  This chart was scribed for Rita Sprout, MD by Danella Maiers, ED Scribe. This patient was seen in room MH09/MH09 and the patient's care was started at 10:10 PM.   Chief Complaint  Patient presents with  . URI   Patient is a 27 y.o. female presenting with URI. The history is provided by the patient. No language interpreter was used.  URI  HPI Comments: Rita ROUNDTREE is a 28 y.o. female who presents to the Emergency Department complaining of cough with sputum for the past seven days. She reports associated nasal congestion. She had a fever three days ago of 101 but not currently. Pt was seen here one week ago for the same symptoms. She has a h/o asthma and has experienced moderate relief with her inhaler. She has been on antibiotics for the past five days. She does not smoke.   Past Medical History  Diagnosis Date  . Eczema   . GERD (gastroesophageal reflux disease)   . Asthma    Past Surgical History  Procedure Laterality Date  . Tonsillectomy    . Cholecystectomy     No family history on file. History  Substance Use Topics  . Smoking status: Never Smoker   . Smokeless tobacco: Not on file  . Alcohol Use: Yes     Comment: occ   OB History   Grav Para Term Preterm Abortions TAB SAB Ect Mult Living                 Review of Systems A complete 10 system review of systems was obtained and all systems are negative except as noted in the HPI and PMH.   Allergies  Review of patient's allergies indicates no known allergies.  Home Medications   Current Outpatient Rx  Name  Route  Sig  Dispense  Refill  . acetaZOLAMIDE (DIAMOX) 250 MG tablet   Oral   Take 250 mg by mouth 2 (two) times daily.         . ALBUTEROL SULFATE HFA IN   Inhalation   Inhale into the lungs.         . cyclobenzaprine (FLEXERIL) 10 MG tablet      Take 1 tablet 3 times a day as needed for TMJ spasm.   20 tablet   0   .  Esomeprazole Magnesium (NEXIUM PO)   Oral   Take by mouth.         . Fexofenadine HCl (ALLEGRA PO)   Oral   Take by mouth.         . fluticasone (FLONASE) 50 MCG/ACT nasal spray   Nasal   Place 2 sprays into the nose daily.         Marland Kitchen HYDROcodone-acetaminophen (NORCO/VICODIN) 5-325 MG per tablet   Oral   Take 1-2 tablets by mouth every 6 (six) hours as needed for pain.   20 tablet   0   . meloxicam (MOBIC) 15 MG tablet   Oral   Take 1 tablet (15 mg total) by mouth daily.   30 tablet   0   . norgestrel-ethinyl estradiol (LO/OVRAL,CRYSELLE) 0.3-30 MG-MCG tablet   Oral   Take 1 tablet by mouth daily.         . traMADol (ULTRAM) 50 MG tablet   Oral   Take 50 mg by mouth every 6 (six) hours as needed for pain.  BP 121/91  Pulse 87  Temp(Src) 98.4 F (36.9 C) (Oral)  Resp 18  Ht 5\' 9"  (1.753 m)  Wt 267 lb (121.11 kg)  BMI 39.41 kg/m2  SpO2 100%  LMP 09/03/2013 Physical Exam  Nursing note and vitals reviewed. Constitutional: She is oriented to person, place, and time. She appears well-developed and well-nourished. No distress.  HENT:  Head: Normocephalic and atraumatic.  Right Ear: Tympanic membrane normal.  Left Ear: Tympanic membrane normal.  Eyes: EOM are normal.  Neck: Neck supple. No tracheal deviation present.  Cardiovascular: Normal rate.   Pulmonary/Chest: Effort normal. No respiratory distress.  Musculoskeletal: Normal range of motion.  Neurological: She is alert and oriented to person, place, and time.  Skin: Skin is warm and dry.  Psychiatric: She has a normal mood and affect. Her behavior is normal.    ED Course  Procedures (including critical care time) Medications - No data to display  DIAGNOSTIC STUDIES: Oxygen Saturation is 100% on RA, normal by my interpretation.    COORDINATION OF CARE: 10:22 PM- Discussed treatment plan with pt. Pt agrees to plan.    Labs Review Labs Reviewed - No data to display Imaging Review Dg  Chest 2 View  09/16/2013   CLINICAL DATA:  Cough. History of asthma.  EXAM: CHEST  2 VIEW  COMPARISON:  None currently available.  FINDINGS: The heart size and mediastinal contours are within normal limits. Both lungs are clear. Mild dextro curvature of the upper thoracic spine. Cholecystectomy.  IMPRESSION: No active cardiopulmonary disease.   Electronically Signed   By: Tiburcio Pea M.D.   On: 09/16/2013 22:45    EKG Interpretation   None       MDM   1. Bronchitis     Pt with symptoms consistent with viral URI.  Well appearing here.  No signs of breathing difficulty  No signs of pharyngitis, otitis or abnormal abdominal findings.   CXR wnl and pt to return with any further problems. Pt already on abx, steroids and inhaler      I personally performed the services described in this documentation, which was scribed in my presence.  The recorded information has been reviewed and considered.    Rita Sprout, MD 09/16/13 1610  Rita Sprout, MD 09/16/13 9604  Rita Sprout, MD 09/16/13 [phone]

## 2013-09-16 NOTE — ED Notes (Signed)
MD at bedside to discuss results of testing. 

## 2013-09-16 NOTE — ED Notes (Signed)
Pt reports cold symptoms for about 1 week.  Reports productive sputum.  Fever 3 days ago but not currently.

## 2013-09-16 NOTE — ED Notes (Signed)
Patient transported to X-ray ambulatory with tech. 

## 2014-05-08 ENCOUNTER — Emergency Department (HOSPITAL_BASED_OUTPATIENT_CLINIC_OR_DEPARTMENT_OTHER)
Admission: EM | Admit: 2014-05-08 | Discharge: 2014-05-08 | Disposition: A | Discharge status: Home / Self Care | Payer: 59 | Attending: Emergency Medicine | Admitting: Emergency Medicine

## 2014-05-08 ENCOUNTER — Encounter (HOSPITAL_BASED_OUTPATIENT_CLINIC_OR_DEPARTMENT_OTHER): Payer: Self-pay | Admitting: Emergency Medicine

## 2014-05-08 DIAGNOSIS — Z791 Long term (current) use of non-steroidal anti-inflammatories (NSAID): Secondary | ICD-10-CM | POA: Insufficient documentation

## 2014-05-08 DIAGNOSIS — Z872 Personal history of diseases of the skin and subcutaneous tissue: Secondary | ICD-10-CM | POA: Insufficient documentation

## 2014-05-08 DIAGNOSIS — R630 Anorexia: Secondary | ICD-10-CM | POA: Insufficient documentation

## 2014-05-08 DIAGNOSIS — Z9089 Acquired absence of other organs: Secondary | ICD-10-CM | POA: Insufficient documentation

## 2014-05-08 DIAGNOSIS — R109 Unspecified abdominal pain: Secondary | ICD-10-CM

## 2014-05-08 DIAGNOSIS — Z792 Long term (current) use of antibiotics: Secondary | ICD-10-CM | POA: Insufficient documentation

## 2014-05-08 DIAGNOSIS — J45909 Unspecified asthma, uncomplicated: Secondary | ICD-10-CM | POA: Insufficient documentation

## 2014-05-08 DIAGNOSIS — K219 Gastro-esophageal reflux disease without esophagitis: Secondary | ICD-10-CM | POA: Insufficient documentation

## 2014-05-08 DIAGNOSIS — R110 Nausea: Secondary | ICD-10-CM | POA: Insufficient documentation

## 2014-05-08 DIAGNOSIS — Z79899 Other long term (current) drug therapy: Secondary | ICD-10-CM | POA: Insufficient documentation

## 2014-05-08 DIAGNOSIS — R1013 Epigastric pain: Secondary | ICD-10-CM | POA: Insufficient documentation

## 2014-05-08 DIAGNOSIS — IMO0002 Reserved for concepts with insufficient information to code with codable children: Secondary | ICD-10-CM | POA: Insufficient documentation

## 2014-05-08 DIAGNOSIS — Z3202 Encounter for pregnancy test, result negative: Secondary | ICD-10-CM | POA: Insufficient documentation

## 2014-05-08 LAB — URINALYSIS, ROUTINE W REFLEX MICROSCOPIC
BILIRUBIN URINE: NEGATIVE
Glucose, UA: NEGATIVE mg/dL
HGB URINE DIPSTICK: NEGATIVE
KETONES UR: NEGATIVE mg/dL
Leukocytes, UA: NEGATIVE
NITRITE: NEGATIVE
Protein, ur: NEGATIVE mg/dL
Specific Gravity, Urine: 1.02 (ref 1.005–1.030)
UROBILINOGEN UA: 1 mg/dL (ref 0.0–1.0)
pH: 5.5 (ref 5.0–8.0)

## 2014-05-08 LAB — HEPATIC FUNCTION PANEL
ALK PHOS: 82 U/L (ref 39–117)
ALT: 18 U/L (ref 0–35)
AST: 17 U/L (ref 0–37)
Albumin: 3.5 g/dL (ref 3.5–5.2)
TOTAL PROTEIN: 7.4 g/dL (ref 6.0–8.3)

## 2014-05-08 LAB — BASIC METABOLIC PANEL
BUN: 12 mg/dL (ref 6–23)
CALCIUM: 9.2 mg/dL (ref 8.4–10.5)
CO2: 18 mEq/L — ABNORMAL LOW (ref 19–32)
CREATININE: 0.9 mg/dL (ref 0.50–1.10)
Chloride: 109 mEq/L (ref 96–112)
GFR calc Af Amer: >90 mL/min (ref >90)
GFR, EST NON AFRICAN AMERICAN: 86 mL/min — AB (ref >90)
GLUCOSE: 89 mg/dL (ref 70–99)
Potassium: 3.5 mEq/L — ABNORMAL LOW (ref 3.7–5.3)
SODIUM: 140 meq/L (ref 137–147)

## 2014-05-08 LAB — CBC WITH DIFFERENTIAL/PLATELET
Basophils Absolute: 0 10*3/uL (ref 0.0–0.1)
Basophils Relative: 0 % (ref 0–1)
EOS ABS: 0.1 10*3/uL (ref 0.0–0.7)
EOS PCT: 1 % (ref 0–5)
HEMATOCRIT: 39 % (ref 36.0–46.0)
Hemoglobin: 12.8 g/dL (ref 12.0–15.0)
LYMPHS ABS: 3.4 10*3/uL (ref 0.7–4.0)
Lymphocytes Relative: 39 % (ref 12–46)
MCH: 28.8 pg (ref 26.0–34.0)
MCHC: 32.8 g/dL (ref 30.0–36.0)
MCV: 87.8 fL (ref 78.0–100.0)
MONO ABS: 0.3 10*3/uL (ref 0.1–1.0)
Monocytes Relative: 4 % (ref 3–12)
Neutro Abs: 4.9 10*3/uL (ref 1.7–7.7)
Neutrophils Relative %: 56 % (ref 43–77)
PLATELETS: 238 10*3/uL (ref 150–400)
RBC: 4.44 MIL/uL (ref 3.87–5.11)
RDW: 13.1 % (ref 11.5–15.5)
WBC: 8.8 10*3/uL (ref 4.0–10.5)

## 2014-05-08 LAB — LIPASE, BLOOD: Lipase: 28 U/L (ref 11–59)

## 2014-05-08 LAB — PREGNANCY, URINE: Preg Test, Ur: NEGATIVE

## 2014-05-08 MED ORDER — ONDANSETRON 4 MG PO TBDP: ORAL_TABLET | ORAL | Status: DC

## 2014-05-08 MED ORDER — KETOROLAC TROMETHAMINE 30 MG/ML IJ SOLN
30.0000 mg | Freq: Once | INTRAMUSCULAR | Status: AC
  Administered 2014-05-08: 30 mg via INTRAVENOUS
  Filled 2014-05-08: qty 1

## 2014-05-08 MED ORDER — SODIUM CHLORIDE 0.9 % IV BOLUS (SEPSIS)
1000.0000 mL | Freq: Once | INTRAVENOUS | Status: AC
  Administered 2014-05-08: 1000 mL via INTRAVENOUS

## 2014-05-08 MED ORDER — ONDANSETRON HCL 4 MG/2ML IJ SOLN
4.0000 mg | Freq: Once | INTRAMUSCULAR | Status: AC
  Administered 2014-05-08: 4 mg via INTRAVENOUS
  Filled 2014-05-08: qty 2

## 2014-05-08 NOTE — ED Provider Notes (Signed)
CSN: [id number]     Arrival date & time 05/08/14  0404 History   First MD Initiated Contact with Patient 05/08/14 [phone]     Chief Complaint  Patient presents with  . Abdominal Pain     (Consider location/radiation/quality/duration/timing/severity/associated sxs/prior Treatment) HPI Comments: 28 year old female with IBS, kidney stone, reflux, obesity, cholecystectomy history presents with central and upper abdominal pain radiating across upper abdomen since 9:30 PM. Pain is sharp and intermittent. Patient feels is similar to her kidney stone history for which she passed on her own. Patient denies urinary or vaginal symptoms. Mild nausea without vomiting. No diarrhea. With her IBS usually gets diarrhea she feels this is not her IBS. No fevers or chills.  Patient is a 28 y.o. female presenting with abdominal pain. The history is provided by the patient.  Abdominal Pain Associated symptoms: nausea   Associated symptoms: no chest pain, no chills, no dysuria, no fever, no shortness of breath, no vaginal discharge and no vomiting     Past Medical History  Diagnosis Date  . Eczema   . GERD (gastroesophageal reflux disease)   . Asthma    Past Surgical History  Procedure Laterality Date  . Tonsillectomy    . Cholecystectomy     No family history on file. History  Substance Use Topics  . Smoking status: Never Smoker   . Smokeless tobacco: Not on file  . Alcohol Use: Yes     Comment: occ   OB History   Grav Para Term Preterm Abortions TAB SAB Ect Mult Living                 Review of Systems  Constitutional: Positive for appetite change. Negative for fever and chills.  HENT: Negative for congestion.   Eyes: Negative for visual disturbance.  Respiratory: Negative for shortness of breath.   Cardiovascular: Negative for chest pain.  Gastrointestinal: Positive for nausea and abdominal pain. Negative for vomiting and blood in stool.  Genitourinary: Negative for dysuria, flank pain and  vaginal discharge.  Musculoskeletal: Negative for back pain, neck pain and neck stiffness.  Skin: Negative for rash.  Neurological: Negative for light-headedness and headaches.      Allergies  Review of patient's allergies indicates no known allergies.  Home Medications   Prior to Admission medications   Medication Sig Start Date End Date Taking? Authorizing Provider  acetaZOLAMIDE (DIAMOX) 250 MG tablet Take 250 mg by mouth 2 (two) times daily.   Yes Historical Provider, MD  Esomeprazole Magnesium (NEXIUM PO) Take by mouth.   Yes Historical Provider, MD  Fexofenadine HCl (ALLEGRA PO) Take by mouth.   Yes Historical Provider, MD  norgestrel-ethinyl estradiol (LO/OVRAL,CRYSELLE) 0.3-30 MG-MCG tablet Take 1 tablet by mouth daily.   Yes Historical Provider, MD  ALBUTEROL SULFATE HFA IN Inhale into the lungs.    Historical Provider, MD  amoxicillin-clavulanate (AUGMENTIN) 250-125 MG per tablet Take 1 tablet by mouth 3 (three) times daily.    Historical Provider, MD  cyclobenzaprine (FLEXERIL) 10 MG tablet Take 1 tablet 3 times a day as needed for TMJ spasm. 05/10/13   John L Molpus, MD  fluticasone (FLONASE) 50 MCG/ACT nasal spray Place 2 sprays into the nose daily.    Historical Provider, MD  HYDROcodone-acetaminophen (NORCO/VICODIN) 5-325 MG per tablet Take 1-2 tablets by mouth every 6 (six) hours as needed for pain. 05/10/13   Carlisle Beers Molpus, MD  meloxicam (MOBIC) 15 MG tablet Take 1 tablet (15 mg total) by mouth daily. 03/27/13  Charles B. Bernette MayersSheldon, MD  predniSONE (STERAPRED UNI-PAK) 10 MG tablet Take 10 mg by mouth daily.    Historical Provider, MD  traMADol (ULTRAM) 50 MG tablet Take 50 mg by mouth every 6 (six) hours as needed for pain.    Historical Provider, MD   BP 123/77  Pulse 86  Temp(Src) 98.1 F (36.7 C) (Oral)  Resp 18  Ht 5\' 9"  (1.753 m)  Wt 284 lb (128.822 kg)  BMI 41.92 kg/m2  SpO2 99%  LMP 04/17/2014 Physical Exam  Nursing note and vitals reviewed. Constitutional:  She is oriented to person, place, and time. She appears well-developed and well-nourished.  HENT:  Head: Normocephalic and atraumatic.  Mild dry mucous membranes  Eyes: Conjunctivae are normal. Right eye exhibits no discharge. Left eye exhibits no discharge.  Neck: Normal range of motion. Neck supple. No tracheal deviation present.  Cardiovascular: Normal rate and regular rhythm.   Pulmonary/Chest: Effort normal and breath sounds normal.  Abdominal: Soft. She exhibits no distension. There is tenderness (epigastric). There is no guarding.  Musculoskeletal: She exhibits no edema.  Neurological: She is alert and oriented to person, place, and time.  Skin: Skin is warm. No rash noted.  Psychiatric: She has a normal mood and affect.    ED Course  Procedures (including critical care time) Emergency Focused Ultrasound Exam Limited retroperitoneal ultrasound of kidneys  Performed and interpreted by Dr. Jodi MourningZavitz Indication: flank pain Focused abdominal ultrasound with both kidneys imaged in transverse and longitudinal planes in real-time. Interpretation: ?Mild left hydronephrosis visualized.  Difficult due to body habitus Images archived electronically  Labs Review Labs Reviewed  BASIC METABOLIC PANEL - Abnormal; Notable for the following:    Potassium 3.5 (*)    CO2 18 (*)    GFR calc non Af Amer 86 (*)    All other components within normal limits  HEPATIC FUNCTION PANEL - Abnormal; Notable for the following:    Total Bilirubin <0.2 (*)    All other components within normal limits  URINALYSIS, ROUTINE W REFLEX MICROSCOPIC  PREGNANCY, URINE  CBC WITH DIFFERENTIAL  LIPASE, BLOOD    Imaging Review No results found.   EKG Interpretation None      MDM   Final diagnoses:  Abdominal pain  Nausea   Patient presents with epigastric abdominal pain with radiation across upper abdomen. Although she says it feels like her kidney stone history is atypical for that with no hematuria  no flank pain. Bedside ultrasound possibly mild left hydronephrosis however no significant hydronephrosis seen. Patient overall well-appearing in no right lower quadrant abdominal pain. No fever no white count. At this time differential gastritis, pancreatitis, worsening IBS, atypical kidney stone, other. Plan for fluids, nausea meds, pain meds and reevaluation.  Patient well-appearing on recheck with no significant abdominal pain, definitely no lower, pain and mild epigastric. Patient as very atypical presentation at this time I do not think the risk of CAT scan and a very low pretest probably patient has indicated. Strict reasons to return given the patient has followup outpatient. Results and differential diagnosis were discussed with the patient/parent/guardian. Close follow up outpatient was discussed, comfortable with the plan.   Filed Vitals:   05/08/14 0416  BP: 123/77  Pulse: 86  Temp: 98.1 F (36.7 C)  TempSrc: Oral  Resp: 18  Height: 5\' 9"  (1.753 m)  Weight: 284 lb (128.822 kg)  SpO2: 99%         Enid SkeensJoshua M Larri Brewton, MD 05/08/14 [phone]

## 2014-05-08 NOTE — ED Notes (Signed)
Pt c/o upper abdominal pain going straight across with nausea since 9:30pm; states sharp pain comes and goes for 30secs; states has had this 12yrs ago and it was kidney stones. Pt denies urinary difficulty

## 2014-05-08 NOTE — Discharge Instructions (Signed)
If your abdominal pain worsens, you develop fevers, persistent vomiting or if your pain moves to the right lower quadrant return immediately to see your physician or come to the Emergency Department.  Thank you If you were given medicines take as directed.  If you are on coumadin or contraceptives realize their levels and effectiveness is altered by many different medicines.  If you have any reaction (rash, tongues swelling, other) to the medicines stop taking and see a physician.   Please follow up as directed and return to the ER or see a physician for new or worsening symptoms.  Thank you. Filed Vitals:   05/08/14 0416  BP: 123/77  Pulse: 86  Temp: 98.1 F (36.7 C)  TempSrc: Oral  Resp: 18  Height: 5\' 9"  (1.753 m)  Weight: 284 lb (128.822 kg)  SpO2: 99%

## 2015-03-18 ENCOUNTER — Encounter (HOSPITAL_BASED_OUTPATIENT_CLINIC_OR_DEPARTMENT_OTHER): Payer: Self-pay | Admitting: *Deleted

## 2015-03-18 ENCOUNTER — Emergency Department (HOSPITAL_BASED_OUTPATIENT_CLINIC_OR_DEPARTMENT_OTHER)
Admission: EM | Admit: 2015-03-18 | Discharge: 2015-03-18 | Disposition: A | Discharge status: Home / Self Care | Payer: 59 | Attending: Emergency Medicine | Admitting: Emergency Medicine

## 2015-03-18 DIAGNOSIS — Z720 Tobacco use: Secondary | ICD-10-CM | POA: Insufficient documentation

## 2015-03-18 DIAGNOSIS — F419 Anxiety disorder, unspecified: Secondary | ICD-10-CM | POA: Insufficient documentation

## 2015-03-18 DIAGNOSIS — Z793 Long term (current) use of hormonal contraceptives: Secondary | ICD-10-CM | POA: Diagnosis not present

## 2015-03-18 DIAGNOSIS — Z79899 Other long term (current) drug therapy: Secondary | ICD-10-CM | POA: Diagnosis not present

## 2015-03-18 DIAGNOSIS — Z3202 Encounter for pregnancy test, result negative: Secondary | ICD-10-CM | POA: Diagnosis not present

## 2015-03-18 DIAGNOSIS — K219 Gastro-esophageal reflux disease without esophagitis: Secondary | ICD-10-CM | POA: Insufficient documentation

## 2015-03-18 DIAGNOSIS — Z7951 Long term (current) use of inhaled steroids: Secondary | ICD-10-CM | POA: Diagnosis not present

## 2015-03-18 DIAGNOSIS — R51 Headache: Secondary | ICD-10-CM | POA: Diagnosis present

## 2015-03-18 DIAGNOSIS — J018 Other acute sinusitis: Secondary | ICD-10-CM

## 2015-03-18 DIAGNOSIS — Z872 Personal history of diseases of the skin and subcutaneous tissue: Secondary | ICD-10-CM | POA: Insufficient documentation

## 2015-03-18 DIAGNOSIS — J45909 Unspecified asthma, uncomplicated: Secondary | ICD-10-CM | POA: Diagnosis not present

## 2015-03-18 DIAGNOSIS — Z7952 Long term (current) use of systemic steroids: Secondary | ICD-10-CM | POA: Insufficient documentation

## 2015-03-18 HISTORY — DX: Anxiety disorder, unspecified: F41.9

## 2015-03-18 HISTORY — DX: Diaphragmatic hernia without obstruction or gangrene: K44.9

## 2015-03-18 LAB — URINALYSIS, ROUTINE W REFLEX MICROSCOPIC
Bilirubin Urine: NEGATIVE
GLUCOSE, UA: NEGATIVE mg/dL
KETONES UR: NEGATIVE mg/dL
LEUKOCYTES UA: NEGATIVE
Nitrite: NEGATIVE
PROTEIN: NEGATIVE mg/dL
Specific Gravity, Urine: 1.013 (ref 1.005–1.030)
Urobilinogen, UA: 1 mg/dL (ref 0.0–1.0)
pH: 6.5 (ref 5.0–8.0)

## 2015-03-18 LAB — URINE MICROSCOPIC-ADD ON

## 2015-03-18 LAB — PREGNANCY, URINE: Preg Test, Ur: NEGATIVE

## 2015-03-18 MED ORDER — AMOXICILLIN 500 MG PO CAPS: 500.0000 mg | ORAL_CAPSULE | Freq: Three times a day (TID) | ORAL | Status: DC

## 2015-03-18 NOTE — ED Notes (Signed)
Pt states she is currently on her menses.

## 2015-03-18 NOTE — Discharge Instructions (Signed)
Return to the ED with any concerns including difficulty breathing, vomiting and not able to keep down medications, decreased level of alertness/lethargy, or any other alarming symptoms

## 2015-03-18 NOTE — ED Provider Notes (Signed)
CSN: [id number]     Arrival date & time 03/18/15  4098 History   First MD Initiated Contact with Patient 03/18/15 0701     Chief Complaint  Patient presents with  . Facial Pain     (Consider location/radiation/quality/duration/timing/severity/associated sxs/prior Treatment) HPI  Pt presenting with c/o facial pain in her sinuses.  She takes allegra D for allergies and sinus congestion but over the past few days she has developed pain in her right sided of face and teeth. Low grade fever.  No vomiting.  No changes in vision.   She also c/o some diffuse abdominal pain and back pain.  No urinary frequency or dysuria.  She is currently on her menses.  There are no other associated systemic symptoms, there are no other alleviating or modifying factors.   Past Medical History  Diagnosis Date  . Eczema   . GERD (gastroesophageal reflux disease)   . Asthma   . Hiatal hernia   . Anxiety    Past Surgical History  Procedure Laterality Date  . Tonsillectomy    . Cholecystectomy     History reviewed. No pertinent family history. History  Substance Use Topics  . Smoking status: Current Some Day Smoker  . Smokeless tobacco: Not on file  . Alcohol Use: Yes     Comment: occ   OB History    No data available     Review of Systems  ROS reviewed and all otherwise negative except for mentioned in HPI    Allergies  Review of patient's allergies indicates no known allergies.  Home Medications   Prior to Admission medications   Medication Sig Start Date End Date Taking? Authorizing Provider  LORazepam (ATIVAN) 1 MG tablet Take 1 mg by mouth 2 (two) times daily.   Yes Historical Provider, MD  acetaZOLAMIDE (DIAMOX) 250 MG tablet Take 250 mg by mouth 2 (two) times daily.    Historical Provider, MD  ALBUTEROL SULFATE HFA IN Inhale into the lungs.    Historical Provider, MD  amoxicillin (AMOXIL) 500 MG capsule Take 1 capsule (500 mg total) by mouth 3 (three) times daily. 03/18/15   Jerelyn Scott, MD  amoxicillin-clavulanate (AUGMENTIN) 250-125 MG per tablet Take 1 tablet by mouth 3 (three) times daily.    Historical Provider, MD  cyclobenzaprine (FLEXERIL) 10 MG tablet Take 1 tablet 3 times a day as needed for TMJ spasm. 05/10/13   John Molpus, MD  Esomeprazole Magnesium (NEXIUM PO) Take by mouth.    Historical Provider, MD  Fexofenadine HCl (ALLEGRA PO) Take by mouth.    Historical Provider, MD  fluticasone (FLONASE) 50 MCG/ACT nasal spray Place 2 sprays into the nose daily.    Historical Provider, MD  HYDROcodone-acetaminophen (NORCO/VICODIN) 5-325 MG per tablet Take 1-2 tablets by mouth every 6 (six) hours as needed for pain. 05/10/13   John Molpus, MD  meloxicam (MOBIC) 15 MG tablet Take 1 tablet (15 mg total) by mouth daily. 03/27/13   Susy Frizzle, MD  norgestrel-ethinyl estradiol (LO/OVRAL,CRYSELLE) 0.3-30 MG-MCG tablet Take 1 tablet by mouth daily.    Historical Provider, MD  ondansetron (ZOFRAN ODT) 4 MG disintegrating tablet  ODT q4 hours prn nausea/vomit 05/08/14   Blane Ohara, MD  predniSONE (STERAPRED UNI-PAK) 10 MG tablet Take 10 mg by mouth daily.    Historical Provider, MD  traMADol (ULTRAM) 50 MG tablet Take 50 mg by mouth every 6 (six) hours as needed for pain.    Historical Provider, MD   BP 126/77 mmHg  Pulse 84  Temp(Src) 99.2 F (37.3 C) (Oral)  Resp 20  Ht 5\' 9"  (1.753 m)  Wt 285 lb (129.275 kg)  BMI 42.07 kg/m2  SpO2 100%  Vitals reviewed Physical Exam  Physical Examination: General appearance - alert, well appearing, and in no distress Mental status - alert, oriented to person, place, and time Eyes - no conjunctival injection, no scleral icterus Face- ttp over maxillary and frontal sinuses, worse right over left Mouth - mucous membranes moist, pharynx normal without lesions Neck - nontender, no sig LAD Chest - clear to auscultation, no wheezes, rales or rhonchi, symmetric air entry Heart - normal rate, regular rhythm, normal S1, S2, no murmurs,  rubs, clicks or gallops Abdomen - soft, nontender, nondistended, no masses or organomegaly Extremities - peripheral pulses normal, no pedal edema, no clubbing or cyanosis Skin - normal coloration and turgor, no rashes  ED Course  Procedures (including critical care time) Labs Review Labs Reviewed  URINALYSIS, ROUTINE W REFLEX MICROSCOPIC - Abnormal; Notable for the following:    Hgb urine dipstick LARGE (*)    All other components within normal limits  URINE MICROSCOPIC-ADD ON - Abnormal; Notable for the following:    Squamous Epithelial / LPF FEW (*)    Bacteria, UA MANY (*)    All other components within normal limits  URINE CULTURE  PREGNANCY, URINE    Imaging Review No results found.   EKG Interpretation None      MDM   Final diagnoses:  Other acute sinusitis    Pt presenting with c/o facial pain and congestion with low grade fever.  She has been taking allegra d without much relief.  Pt is tender over her sinuses, started on amoxicillin.  Urine with RBCs and bacteria- she is currently on her menses, urine culture was sent.  Discharged with strict return precautions.  Pt agreeable with plan.    Jerelyn ScottMartha Linker, MD 03/18/15 [phone]

## 2015-03-18 NOTE — ED Notes (Signed)
C/o congestion, facial pain, denies cough, abd pain denies n/v/d  X 4 days

## 2015-03-18 NOTE — ED Notes (Signed)
Pt c/o sinus pressurer w pain in jaws x 3-4 days  Also abd pain x 4 days  Denies n/v/d

## 2015-03-19 ENCOUNTER — Encounter (HOSPITAL_BASED_OUTPATIENT_CLINIC_OR_DEPARTMENT_OTHER): Payer: Self-pay

## 2015-03-19 ENCOUNTER — Emergency Department (HOSPITAL_BASED_OUTPATIENT_CLINIC_OR_DEPARTMENT_OTHER): Payer: Self-pay

## 2015-03-19 ENCOUNTER — Emergency Department (HOSPITAL_BASED_OUTPATIENT_CLINIC_OR_DEPARTMENT_OTHER)
Admission: EM | Admit: 2015-03-19 | Discharge: 2015-03-19 | Disposition: A | Discharge status: Home / Self Care | Payer: Self-pay | Attending: Emergency Medicine | Admitting: Emergency Medicine

## 2015-03-19 DIAGNOSIS — Z872 Personal history of diseases of the skin and subcutaneous tissue: Secondary | ICD-10-CM | POA: Insufficient documentation

## 2015-03-19 DIAGNOSIS — H9203 Otalgia, bilateral: Secondary | ICD-10-CM | POA: Insufficient documentation

## 2015-03-19 DIAGNOSIS — K219 Gastro-esophageal reflux disease without esophagitis: Secondary | ICD-10-CM | POA: Insufficient documentation

## 2015-03-19 DIAGNOSIS — Z7951 Long term (current) use of inhaled steroids: Secondary | ICD-10-CM | POA: Insufficient documentation

## 2015-03-19 DIAGNOSIS — Z791 Long term (current) use of non-steroidal anti-inflammatories (NSAID): Secondary | ICD-10-CM | POA: Insufficient documentation

## 2015-03-19 DIAGNOSIS — Z720 Tobacco use: Secondary | ICD-10-CM | POA: Insufficient documentation

## 2015-03-19 DIAGNOSIS — J45909 Unspecified asthma, uncomplicated: Secondary | ICD-10-CM | POA: Insufficient documentation

## 2015-03-19 DIAGNOSIS — Z7952 Long term (current) use of systemic steroids: Secondary | ICD-10-CM | POA: Insufficient documentation

## 2015-03-19 DIAGNOSIS — F419 Anxiety disorder, unspecified: Secondary | ICD-10-CM | POA: Insufficient documentation

## 2015-03-19 DIAGNOSIS — Z79899 Other long term (current) drug therapy: Secondary | ICD-10-CM | POA: Insufficient documentation

## 2015-03-19 DIAGNOSIS — Z792 Long term (current) use of antibiotics: Secondary | ICD-10-CM | POA: Insufficient documentation

## 2015-03-19 LAB — URINE CULTURE

## 2015-03-19 MED ORDER — KETOROLAC TROMETHAMINE 60 MG/2ML IM SOLN
60.0000 mg | Freq: Once | INTRAMUSCULAR | Status: AC
  Administered 2015-03-19: 60 mg via INTRAMUSCULAR
  Filled 2015-03-19: qty 2

## 2015-03-19 MED ORDER — MELOXICAM 15 MG PO TABS: 15.0000 mg | ORAL_TABLET | Freq: Every day | ORAL | Status: DC

## 2015-03-19 MED ORDER — ONDANSETRON 8 MG PO TBDP
8.0000 mg | ORAL_TABLET | Freq: Once | ORAL | Status: AC
  Administered 2015-03-19: 8 mg via ORAL
  Filled 2015-03-19: qty 1

## 2015-03-19 NOTE — ED Provider Notes (Signed)
CSN: [id number]     Arrival date & time 03/19/15  4098 History   First MD Initiated Contact with Patient 03/19/15 [phone]     Chief Complaint  Patient presents with  . Otalgia     (Consider location/radiation/quality/duration/timing/severity/associated sxs/prior Treatment) Patient is a 29 y.o. female presenting with ear pain. The history is provided by the patient.  Otalgia Location:  Bilateral Behind ear:  No abnormality Quality:  Aching Severity:  Severe Onset quality:  Gradual Duration:  1 day Timing:  Constant Progression:  Worsening Chronicity:  New Context: not direct blow   Relieved by:  Nothing Worsened by:  Nothing tried Ineffective treatments: norco and amoxicillin. Associated symptoms: sore throat   Associated symptoms: no ear discharge, no fever, no hearing loss, no neck pain, no rash, no rhinorrhea, no tinnitus and no vomiting   Associated symptoms comment:  Facial pain Risk factors: no recent travel   Seen earlier today for facial pain and placed on amoxicillin now pain is worse despite norco  Past Medical History  Diagnosis Date  . Eczema   . GERD (gastroesophageal reflux disease)   . Asthma   . Hiatal hernia   . Anxiety    Past Surgical History  Procedure Laterality Date  . Tonsillectomy    . Cholecystectomy     No family history on file. History  Substance Use Topics  . Smoking status: Current Some Day Smoker  . Smokeless tobacco: Not on file  . Alcohol Use: Yes     Comment: occ   OB History    No data available     Review of Systems  Constitutional: Negative for fever.  HENT: Positive for ear pain and sore throat. Negative for drooling, ear discharge, hearing loss, rhinorrhea, tinnitus, trouble swallowing and voice change.   Gastrointestinal: Negative for vomiting.  Musculoskeletal: Negative for neck pain and neck stiffness.  Skin: Negative for rash.  Neurological: Negative for dizziness, seizures, syncope, facial asymmetry, speech  difficulty, weakness, light-headedness and numbness.  All other systems reviewed and are negative.     Allergies  Review of patient's allergies indicates no known allergies.  Home Medications   Prior to Admission medications   Medication Sig Start Date End Date Taking? Authorizing Provider  acetaZOLAMIDE (DIAMOX) 250 MG tablet Take 250 mg by mouth 2 (two) times daily.    Historical Provider, MD  ALBUTEROL SULFATE HFA IN Inhale into the lungs.    Historical Provider, MD  amoxicillin (AMOXIL) 500 MG capsule Take 1 capsule (500 mg total) by mouth 3 (three) times daily. 03/18/15   Jerelyn Scott, MD  amoxicillin-clavulanate (AUGMENTIN) 250-125 MG per tablet Take 1 tablet by mouth 3 (three) times daily.    Historical Provider, MD  cyclobenzaprine (FLEXERIL) 10 MG tablet Take 1 tablet 3 times a day as needed for TMJ spasm. 05/10/13   John Molpus, MD  Esomeprazole Magnesium (NEXIUM PO) Take by mouth.    Historical Provider, MD  Fexofenadine HCl (ALLEGRA PO) Take by mouth.    Historical Provider, MD  fluticasone (FLONASE) 50 MCG/ACT nasal spray Place 2 sprays into the nose daily.    Historical Provider, MD  HYDROcodone-acetaminophen (NORCO/VICODIN) 5-325 MG per tablet Take 1-2 tablets by mouth every 6 (six) hours as needed for pain. 05/10/13   John Molpus, MD  LORazepam (ATIVAN) 1 MG tablet Take 1 mg by mouth 2 (two) times daily.    Historical Provider, MD  meloxicam (MOBIC) 15 MG tablet Take 1 tablet (15 mg total) by mouth  daily. 03/27/13   Susy Frizzleharles Sheldon, MD  norgestrel-ethinyl estradiol (LO/OVRAL,CRYSELLE) 0.3-30 MG-MCG tablet Take 1 tablet by mouth daily.    Historical Provider, MD  ondansetron (ZOFRAN ODT) 4 MG disintegrating tablet 4mg  ODT q4 hours prn nausea/vomit 05/08/14   Blane OharaJoshua Zavitz, MD  predniSONE (STERAPRED UNI-PAK) 10 MG tablet Take 10 mg by mouth daily.    Historical Provider, MD  traMADol (ULTRAM) 50 MG tablet Take 50 mg by mouth every 6 (six) hours as needed for pain.    Historical  Provider, MD   BP 136/82 mmHg  Pulse 86  Temp(Src) 99 F (37.2 C) (Oral)  Resp 20  Ht 5\' 9"  (1.753 m)  Wt 285 lb (129.275 kg)  BMI 42.07 kg/m2  SpO2 100%  LMP 03/19/2015 Physical Exam  Constitutional: She is oriented to person, place, and time. She appears well-developed and well-nourished. No distress.  HENT:  Head: Normocephalic and atraumatic.  Right Ear: No mastoid tenderness. Tympanic membrane is not injected. No hemotympanum.  Left Ear: No mastoid tenderness. Tympanic membrane is not injected. No hemotympanum.  Mouth/Throat: Oropharynx is clear and moist. No oropharyngeal exudate.  Eyes: Conjunctivae and EOM are normal. Pupils are equal, round, and reactive to light.  Neck: Normal range of motion. Neck supple. No tracheal deviation present.  Cardiovascular: Normal rate, regular rhythm and intact distal pulses.   Pulmonary/Chest: Effort normal and breath sounds normal. No respiratory distress. She has no wheezes. She has no rales.  Abdominal: Soft. Bowel sounds are normal. There is no tenderness.  Musculoskeletal: Normal range of motion.  Lymphadenopathy:    She has no cervical adenopathy.  Neurological: She is alert and oriented to person, place, and time. She has normal reflexes. No cranial nerve deficit.  Intact cognition  Skin: Skin is warm and dry.  Psychiatric: She has a normal mood and affect.    ED Course  Procedures (including critical care time) Labs Review Labs Reviewed - No data to display  Imaging Review Ct Head Wo Contrast  03/19/2015   CLINICAL DATA:  Initial evaluation for acute sinus pressure, headache, jaw pain for 3-4 days.  EXAM: CT HEAD WITHOUT CONTRAST  TECHNIQUE: Contiguous axial images were obtained from the base of the skull through the vertex without intravenous contrast.  COMPARISON:  Prior study from 04/19/2013  FINDINGS: There is no acute intracranial hemorrhage or infarct. No mass lesion or midline shift. Gray-white matter differentiation is  well maintained. Ventricles are normal in size without evidence of hydrocephalus. CSF containing spaces are within normal limits. No extra-axial fluid collection.  The calvarium is intact.  Orbital soft tissues are within normal limits.  The paranasal sinuses and mastoid air cells are well pneumatized and free of fluid.  Scalp soft tissues are unremarkable.  IMPRESSION: Normal head CT with no acute intracranial process identified.   Electronically Signed   By: Rise MuBenjamin  McClintock M.D.   On: 03/19/2015 05:10     EKG Interpretation None      MDM   Final diagnoses:  None    Complete course of amoxicillin.  CT head is negative for acute pathology.  Intact EOM and intact cognition highly doubt cavernous sinus thrombosis.  Will prescribe NSAIDs and have patient follow up with dentistry and her PMD.  Strict return precautions given.      Cy BlamerApril Mckinnon Glick, MD 03/19/15 [phone]

## 2015-03-19 NOTE — ED Notes (Signed)
Pt states seen her early dx with sinus infection, c/o severe pain to rt ear/teeth/side of face /neck pain; states took 2 doses of antibiotics with no relief

## 2015-07-17 ENCOUNTER — Encounter (HOSPITAL_BASED_OUTPATIENT_CLINIC_OR_DEPARTMENT_OTHER): Payer: Self-pay | Admitting: *Deleted

## 2015-07-17 ENCOUNTER — Emergency Department (HOSPITAL_BASED_OUTPATIENT_CLINIC_OR_DEPARTMENT_OTHER)
Admission: EM | Admit: 2015-07-17 | Discharge: 2015-07-17 | Disposition: A | Discharge status: Home / Self Care | Payer: 59 | Attending: Physician Assistant | Admitting: Physician Assistant

## 2015-07-17 DIAGNOSIS — Z79899 Other long term (current) drug therapy: Secondary | ICD-10-CM | POA: Insufficient documentation

## 2015-07-17 DIAGNOSIS — Z791 Long term (current) use of non-steroidal anti-inflammatories (NSAID): Secondary | ICD-10-CM | POA: Insufficient documentation

## 2015-07-17 DIAGNOSIS — R109 Unspecified abdominal pain: Secondary | ICD-10-CM | POA: Insufficient documentation

## 2015-07-17 DIAGNOSIS — Z7952 Long term (current) use of systemic steroids: Secondary | ICD-10-CM | POA: Diagnosis not present

## 2015-07-17 DIAGNOSIS — Z792 Long term (current) use of antibiotics: Secondary | ICD-10-CM | POA: Insufficient documentation

## 2015-07-17 DIAGNOSIS — G8929 Other chronic pain: Secondary | ICD-10-CM | POA: Diagnosis not present

## 2015-07-17 DIAGNOSIS — Z793 Long term (current) use of hormonal contraceptives: Secondary | ICD-10-CM | POA: Insufficient documentation

## 2015-07-17 DIAGNOSIS — Z3202 Encounter for pregnancy test, result negative: Secondary | ICD-10-CM | POA: Insufficient documentation

## 2015-07-17 DIAGNOSIS — F419 Anxiety disorder, unspecified: Secondary | ICD-10-CM | POA: Diagnosis not present

## 2015-07-17 DIAGNOSIS — J45909 Unspecified asthma, uncomplicated: Secondary | ICD-10-CM | POA: Insufficient documentation

## 2015-07-17 DIAGNOSIS — K219 Gastro-esophageal reflux disease without esophagitis: Secondary | ICD-10-CM | POA: Diagnosis not present

## 2015-07-17 DIAGNOSIS — Z720 Tobacco use: Secondary | ICD-10-CM | POA: Diagnosis not present

## 2015-07-17 DIAGNOSIS — Z872 Personal history of diseases of the skin and subcutaneous tissue: Secondary | ICD-10-CM | POA: Insufficient documentation

## 2015-07-17 HISTORY — DX: Migraine, unspecified, not intractable, without status migrainosus: G43.909

## 2015-07-17 LAB — URINALYSIS, ROUTINE W REFLEX MICROSCOPIC
BILIRUBIN URINE: NEGATIVE
Glucose, UA: NEGATIVE mg/dL
Hgb urine dipstick: NEGATIVE
Ketones, ur: NEGATIVE mg/dL
LEUKOCYTES UA: NEGATIVE
NITRITE: NEGATIVE
Protein, ur: NEGATIVE mg/dL
Specific Gravity, Urine: 1.023 (ref 1.005–1.030)
Urobilinogen, UA: 1 mg/dL (ref 0.0–1.0)
pH: 6.5 (ref 5.0–8.0)

## 2015-07-17 LAB — LIPASE, BLOOD: LIPASE: 17 U/L — AB (ref 22–51)

## 2015-07-17 LAB — HCG, SERUM, QUALITATIVE: PREG SERUM: NEGATIVE

## 2015-07-17 LAB — PREGNANCY, URINE: PREG TEST UR: NEGATIVE

## 2015-07-17 MED ORDER — SODIUM CHLORIDE 0.9 % IV BOLUS (SEPSIS)
1000.0000 mL | Freq: Once | INTRAVENOUS | Status: AC
  Administered 2015-07-17: 1000 mL via INTRAVENOUS

## 2015-07-17 MED ORDER — LORAZEPAM 1 MG PO TABS
0.5000 mg | ORAL_TABLET | Freq: Once | ORAL | Status: AC
  Administered 2015-07-17: 0.5 mg via ORAL
  Filled 2015-07-17: qty 1

## 2015-07-17 MED ORDER — KETOROLAC TROMETHAMINE 30 MG/ML IJ SOLN
30.0000 mg | Freq: Once | INTRAMUSCULAR | Status: AC
  Administered 2015-07-17: 30 mg via INTRAVENOUS
  Filled 2015-07-17: qty 1

## 2015-07-17 NOTE — ED Provider Notes (Signed)
CSN: [id number]     Arrival date & time 07/17/15  0720 History   First MD Initiated Contact with Patient 07/17/15 [phone]     Chief Complaint  Patient presents with  . Abdominal Pain     (Consider location/radiation/quality/duration/timing/severity/associated sxs/prior Treatment) Patient is a 29 y.o. female presenting with abdominal pain.  Abdominal Pain Associated symptoms: no chest pain, no cough, no dysuria and no fatigue    Patient is a 29 year old female with past history significant for anxiety hiatal hernia chronic migraines asthma GERD chronic abdominal pain and eczema. She is presenting today with vague symptoms of abdominal pain. It is located in her bilateral flanks and radiates to her bladder. Patient was treated with UTI in late July. She states that she thinks she took most of the antibiotics. She has no urinary tract symptoms at the moment. No fevers. Today she woke up today and has intermittent colicky pain occasionally in one or the other flank.  Past Medical History  Diagnosis Date  . Eczema   . GERD (gastroesophageal reflux disease)   . Asthma   . Hiatal hernia   . Anxiety    Past Surgical History  Procedure Laterality Date  . Tonsillectomy    . Cholecystectomy     No family history on file. Social History  Substance Use Topics  . Smoking status: Current Some Day Smoker  . Smokeless tobacco: None  . Alcohol Use: Yes     Comment: occ   OB History    No data available     Review of Systems  Constitutional: Negative for activity change and fatigue.  HENT: Negative for congestion and drooling.   Eyes: Negative for discharge.  Respiratory: Negative for cough and chest tightness.   Cardiovascular: Negative for chest pain.  Gastrointestinal: Positive for abdominal pain. Negative for abdominal distention.  Genitourinary: Positive for flank pain. Negative for dysuria, frequency and difficulty urinating.  Musculoskeletal: Negative for joint swelling.  Skin:  Negative for rash.  Allergic/Immunologic: Negative for immunocompromised state.  Neurological: Negative for seizures and speech difficulty.  Psychiatric/Behavioral: Negative for behavioral problems and agitation. The patient is nervous/anxious.       Allergies  Review of patient's allergies indicates no known allergies.  Home Medications   Prior to Admission medications   Medication Sig Start Date End Date Taking? Authorizing Provider  acetaZOLAMIDE (DIAMOX) 250 MG tablet Take 250 mg by mouth 2 (two) times daily.    Historical Provider, MD  ALBUTEROL SULFATE HFA IN Inhale into the lungs.    Historical Provider, MD  amoxicillin (AMOXIL) 500 MG capsule Take 1 capsule (500 mg total) by mouth 3 (three) times daily. 03/18/15   Jerelyn Scott, MD  amoxicillin-clavulanate (AUGMENTIN) 250-125 MG per tablet Take 1 tablet by mouth 3 (three) times daily.    Historical Provider, MD  cyclobenzaprine (FLEXERIL) 10 MG tablet Take 1 tablet 3 times a day as needed for TMJ spasm. 05/10/13   John Molpus, MD  Esomeprazole Magnesium (NEXIUM PO) Take by mouth.    Historical Provider, MD  Fexofenadine HCl (ALLEGRA PO) Take by mouth.    Historical Provider, MD  fluticasone (FLONASE) 50 MCG/ACT nasal spray Place 2 sprays into the nose daily.    Historical Provider, MD  HYDROcodone-acetaminophen (NORCO/VICODIN) 5-325 MG per tablet Take 1-2 tablets by mouth every 6 (six) hours as needed for pain. 05/10/13   John Molpus, MD  LORazepam (ATIVAN) 1 MG tablet Take 1 mg by mouth 2 (two) times daily.  Historical Provider, MD  meloxicam (MOBIC) 15 MG tablet Take 1 tablet (15 mg total) by mouth daily. 03/27/13   Susy Frizzle, MD  meloxicam (MOBIC) 15 MG tablet Take 1 tablet (15 mg total) by mouth daily. 03/19/15   April Palumbo, MD  norgestrel-ethinyl estradiol (LO/OVRAL,CRYSELLE) 0.3-30 MG-MCG tablet Take 1 tablet by mouth daily.    Historical Provider, MD  ondansetron (ZOFRAN ODT) 4 MG disintegrating tablet  ODT q4 hours  prn nausea/vomit 05/08/14   Blane Ohara, MD  predniSONE (STERAPRED UNI-PAK) 10 MG tablet Take 10 mg by mouth daily.    Historical Provider, MD  traMADol (ULTRAM) 50 MG tablet Take 50 mg by mouth every 6 (six) hours as needed for pain.    Historical Provider, MD   BP 119/74 mmHg  Pulse 89  Temp(Src) 98.6 F (37 C) (Oral)  Resp 20  Ht  (1.753 m)  Wt 280 lb (127.007 kg)  BMI 41.33 kg/m2  SpO2 100% Physical Exam  Constitutional: She is oriented to person, place, and time. She appears well-developed and well-nourished.  HENT:  Head: Normocephalic and atraumatic.  Eyes: Conjunctivae are normal. Right eye exhibits no discharge.  Neck: Neck supple.  Cardiovascular: Normal rate, regular rhythm and normal heart sounds.   No murmur heard. Pulmonary/Chest: Effort normal and breath sounds normal. She has no wheezes. She has no rales.  Abdominal: Soft. She exhibits no distension. There is no tenderness.  Musculoskeletal: Normal range of motion. She exhibits no edema.  Neurological: She is oriented to person, place, and time. No cranial nerve deficit.  Skin: Skin is warm and dry. No rash noted. She is not diaphoretic.  Psychiatric: She has a normal mood and affect. Her behavior is normal.  Nursing note and vitals reviewed.   ED Course  Procedures (including critical care time) Labs Review Labs Reviewed  URINALYSIS, ROUTINE W REFLEX MICROSCOPIC (NOT AT [facility])  PREGNANCY, URINE  HCG, SERUM, QUALITATIVE  LIPASE, BLOOD    Imaging Review No results found. I, Merian Wroe L Amen Dargis, personally reviewed and evaluated these images and lab results as part of my medical decision-making.   EKG Interpretation None      MDM   Final diagnoses:  None   patient is a 29 year old female with multiple medical problems such as chronic abdominal pain, anxiety, UTIs, chronic migraines, chronic epigastric pain. Patient states that she has pain in her bilateral flanks that radiates occasionally down  to her abdomen. It is intermittent. She feels mildly nauseous. No fevers. Eating and drinking normally. Differential includes bilateral kidney stones, pyelonephritis, gas pains. We will do screening labs. We'll give her her antianxiety as well as some fluid and ketorolac to help with the pain. Given her normal physical exam normal vital signs anticipate ability to discharge home.  10:23 AM Patient feels improved. Taking by mouth. Requesting to go home she has family photos today.    Kian Ottaviano Randall An, MD 07/17/15 1023

## 2015-07-17 NOTE — ED Notes (Signed)
Patient c/o lower back pain that radiates to abd. She states that she was treated for a UTI three weeks. Pian is intermittent &B sharp

## 2015-07-17 NOTE — Discharge Instructions (Signed)
We are unsure what caused her pain today. Please return if the pain returns. Please return with any fever or concerning symptoms Abdominal Pain, Women Abdominal (stomach, pelvic, or belly) pain can be caused by many things. It is important to tell your doctor:  The location of the pain.  Does it come and go or is it present all the time?  Are there things that start the pain (eating certain foods, exercise)?  Are there other symptoms associated with the pain (fever, nausea, vomiting, diarrhea)? All of this is helpful to know when trying to find the cause of the pain. CAUSES   Stomach: virus or bacteria infection, or ulcer.  Intestine: appendicitis (inflamed appendix), regional ileitis (Crohn's disease), ulcerative colitis (inflamed colon), irritable bowel syndrome, diverticulitis (inflamed diverticulum of the colon), or cancer of the stomach or intestine.  Gallbladder disease or stones in the gallbladder.  Kidney disease, kidney stones, or infection.  Pancreas infection or cancer.  Fibromyalgia (pain disorder).  Diseases of the female organs:  Uterus: fibroid (non-cancerous) tumors or infection.  Fallopian tubes: infection or tubal pregnancy.  Ovary: cysts or tumors.  Pelvic adhesions (scar tissue).  Endometriosis (uterus lining tissue growing in the pelvis and on the pelvic organs).  Pelvic congestion syndrome (female organs filling up with blood just before the menstrual period).  Pain with the menstrual period.  Pain with ovulation (producing an egg).  Pain with an IUD (intrauterine device, birth control) in the uterus.  Cancer of the female organs.  Functional pain (pain not caused by a disease, may improve without treatment).  Psychological pain.  Depression. DIAGNOSIS  Your doctor will decide the seriousness of your pain by doing an examination.  Blood tests.  X-rays.  Ultrasound.  CT scan (computed tomography, special type of X-ray).  MRI  (magnetic resonance imaging).  Cultures, for infection.  Barium enema (dye inserted in the large intestine, to better view it with X-rays).  Colonoscopy (looking in intestine with a lighted tube).  Laparoscopy (minor surgery, looking in abdomen with a lighted tube).  Major abdominal exploratory surgery (looking in abdomen with a large incision). TREATMENT  The treatment will depend on the cause of the pain.   Many cases can be observed and treated at home.  Over-the-counter medicines recommended by your caregiver.  Prescription medicine.  Antibiotics, for infection.  Birth control pills, for painful periods or for ovulation pain.  Hormone treatment, for endometriosis.  Nerve blocking injections.  Physical therapy.  Antidepressants.  Counseling with a psychologist or psychiatrist.  Minor or major surgery. HOME CARE INSTRUCTIONS   Do not take laxatives, unless directed by your caregiver.  Take over-the-counter pain medicine only if ordered by your caregiver. Do not take aspirin because it can cause an upset stomach or bleeding.  Try a clear liquid diet (broth or water) as ordered by your caregiver. Slowly move to a bland diet, as tolerated, if the pain is related to the stomach or intestine.  Have a thermometer and take your temperature several times a day, and record it.  Bed rest and sleep, if it helps the pain.  Avoid sexual intercourse, if it causes pain.  Avoid stressful situations.  Keep your follow-up appointments and tests, as your caregiver orders.  If the pain does not go away with medicine or surgery, you may try:  Acupuncture.  Relaxation exercises (yoga, meditation).  Group therapy.  Counseling. SEEK MEDICAL CARE IF:   You notice certain foods cause stomach pain.  Your home care treatment  is not helping your pain.  You need stronger pain medicine.  You want your IUD removed.  You feel faint or lightheaded.  You develop nausea and  vomiting.  You develop a rash.  You are having side effects or an allergy to your medicine. SEEK IMMEDIATE MEDICAL CARE IF:   Your pain does not go away or gets worse.  You have a fever.  Your pain is felt only in portions of the abdomen. The right side could possibly be appendicitis. The left lower portion of the abdomen could be colitis or diverticulitis.  You are passing blood in your stools (bright red or black tarry stools, with or without vomiting).  You have blood in your urine.  You develop chills, with or without a fever.  You pass out. MAKE SURE YOU:   Understand these instructions.  Will watch your condition.  Will get help right away if you are not doing well or get worse. Document Released: 09/17/2007 Document Revised: 04/06/2014 Document Reviewed: 10/07/2009 [facility] Patient Information 2015 [place], [place]. This information is not intended to replace advice given to you by your health care provider. Make sure you discuss any questions you have with your health care provider.

## 2015-08-09 ENCOUNTER — Encounter (HOSPITAL_BASED_OUTPATIENT_CLINIC_OR_DEPARTMENT_OTHER): Payer: Self-pay | Admitting: *Deleted

## 2015-08-09 ENCOUNTER — Emergency Department (HOSPITAL_BASED_OUTPATIENT_CLINIC_OR_DEPARTMENT_OTHER)
Admission: EM | Admit: 2015-08-09 | Discharge: 2015-08-09 | Disposition: A | Discharge status: Home / Self Care | Payer: 59 | Attending: Emergency Medicine | Admitting: Emergency Medicine

## 2015-08-09 ENCOUNTER — Emergency Department (HOSPITAL_BASED_OUTPATIENT_CLINIC_OR_DEPARTMENT_OTHER): Payer: 59

## 2015-08-09 DIAGNOSIS — G43909 Migraine, unspecified, not intractable, without status migrainosus: Secondary | ICD-10-CM | POA: Diagnosis not present

## 2015-08-09 DIAGNOSIS — J45909 Unspecified asthma, uncomplicated: Secondary | ICD-10-CM | POA: Diagnosis not present

## 2015-08-09 DIAGNOSIS — F419 Anxiety disorder, unspecified: Secondary | ICD-10-CM | POA: Insufficient documentation

## 2015-08-09 DIAGNOSIS — Z79899 Other long term (current) drug therapy: Secondary | ICD-10-CM | POA: Diagnosis not present

## 2015-08-09 DIAGNOSIS — Z793 Long term (current) use of hormonal contraceptives: Secondary | ICD-10-CM | POA: Diagnosis not present

## 2015-08-09 DIAGNOSIS — Z872 Personal history of diseases of the skin and subcutaneous tissue: Secondary | ICD-10-CM | POA: Diagnosis not present

## 2015-08-09 DIAGNOSIS — K5900 Constipation, unspecified: Secondary | ICD-10-CM | POA: Diagnosis not present

## 2015-08-09 DIAGNOSIS — Z3202 Encounter for pregnancy test, result negative: Secondary | ICD-10-CM | POA: Insufficient documentation

## 2015-08-09 DIAGNOSIS — Z720 Tobacco use: Secondary | ICD-10-CM | POA: Insufficient documentation

## 2015-08-09 DIAGNOSIS — K219 Gastro-esophageal reflux disease without esophagitis: Secondary | ICD-10-CM | POA: Insufficient documentation

## 2015-08-09 DIAGNOSIS — Z7951 Long term (current) use of inhaled steroids: Secondary | ICD-10-CM | POA: Insufficient documentation

## 2015-08-09 LAB — URINALYSIS, ROUTINE W REFLEX MICROSCOPIC
BILIRUBIN URINE: NEGATIVE
GLUCOSE, UA: NEGATIVE mg/dL
HGB URINE DIPSTICK: NEGATIVE
KETONES UR: NEGATIVE mg/dL
Leukocytes, UA: NEGATIVE
Nitrite: NEGATIVE
PH: 5.5 (ref 5.0–8.0)
Protein, ur: NEGATIVE mg/dL
Specific Gravity, Urine: 1.025 (ref 1.005–1.030)
Urobilinogen, UA: 0.2 mg/dL (ref 0.0–1.0)

## 2015-08-09 LAB — PREGNANCY, URINE: Preg Test, Ur: NEGATIVE

## 2015-08-09 MED ORDER — POLYETHYLENE GLYCOL 3350 17 GM/SCOOP PO POWD: 17.0000 g | Freq: Every day | ORAL | Status: DC

## 2015-08-09 MED ORDER — FLEET ENEMA 7-19 GM/118ML RE ENEM
1.0000 | ENEMA | Freq: Once | RECTAL | Status: AC
  Administered 2015-08-09: 1 via RECTAL
  Filled 2015-08-09: qty 1

## 2015-08-09 MED ORDER — ONDANSETRON 4 MG PO TBDP
4.0000 mg | ORAL_TABLET | Freq: Once | ORAL | Status: AC
  Administered 2015-08-09: 4 mg via ORAL

## 2015-08-09 MED ORDER — ONDANSETRON 4 MG PO TBDP
ORAL_TABLET | ORAL | Status: AC
  Filled 2015-08-09: qty 1

## 2015-08-09 MED ORDER — DOCUSATE SODIUM 100 MG PO CAPS: 100.0000 mg | ORAL_CAPSULE | Freq: Two times a day (BID) | ORAL | Status: DC

## 2015-08-09 NOTE — ED Notes (Addendum)
Constipated.  LBM yesterday.

## 2015-08-09 NOTE — ED Provider Notes (Signed)
CSN: [id number]     Arrival date & time 08/09/15  1752 History   First MD Initiated Contact with Patient 08/09/15 1925     Chief Complaint  Patient presents with  . Constipation     (Consider location/radiation/quality/duration/timing/severity/associated sxs/prior Treatment) HPI Comments: Patient presents today with a chief complaint of constipation.  She reports that her last BM was yesterday, but was hard in consistency and that she had to strain with BM.  Last BM prior to that was four days ago.  No treatment prior to arrival.  She denies any hematochezia or melena.  She reports mild associated diffuse abdominal cramping.  She denies nausea, vomiting, fever, or chills.  She does report associated urinary urgency.  Denies frequency or dysuria.  She has a history of Cholecystectomy, but no other abdominal surgeries.    Patient is a 29 y.o. female presenting with constipation. The history is provided by the patient.  Constipation   Past Medical History  Diagnosis Date  . Eczema   . GERD (gastroesophageal reflux disease)   . Asthma   . Hiatal hernia   . Anxiety   . Migraines    Past Surgical History  Procedure Laterality Date  . Tonsillectomy    . Cholecystectomy     History reviewed. No pertinent family history. Social History  Substance Use Topics  . Smoking status: Current Some Day Smoker  . Smokeless tobacco: None  . Alcohol Use: Yes     Comment: occ   OB History    No data available     Review of Systems  Gastrointestinal: Positive for constipation.  All other systems reviewed and are negative.     Allergies  Review of patient's allergies indicates no known allergies.  Home Medications   Prior to Admission medications   Medication Sig Start Date End Date Taking? Authorizing Provider  acetaZOLAMIDE (DIAMOX) 250 MG tablet Take 250 mg by mouth 2 (two) times daily.    Historical Provider, MD  ALBUTEROL SULFATE HFA IN Inhale into the lungs.    Historical  Provider, MD  amoxicillin-clavulanate (AUGMENTIN) 250-125 MG per tablet Take 1 tablet by mouth 3 (three) times daily.    Historical Provider, MD  Esomeprazole Magnesium (NEXIUM PO) Take by mouth.    Historical Provider, MD  Fexofenadine HCl (ALLEGRA PO) Take by mouth.    Historical Provider, MD  fluticasone (FLONASE) 50 MCG/ACT nasal spray Place 2 sprays into the nose daily.    Historical Provider, MD  LORazepam (ATIVAN) 1 MG tablet Take 1 mg by mouth 2 (two) times daily.    Historical Provider, MD  norgestrel-ethinyl estradiol (LO/OVRAL,CRYSELLE) 0.3-30 MG-MCG tablet Take 1 tablet by mouth daily.    Historical Provider, MD  PRAVASTATIN SODIUM PO Take by mouth.    Historical Provider, MD  predniSONE (STERAPRED UNI-PAK) 10 MG tablet Take 10 mg by mouth daily.    Historical Provider, MD  SUMATRIPTAN SUCCINATE PO Take by mouth.    Historical Provider, MD  traMADol (ULTRAM) 50 MG tablet Take 50 mg by mouth every 6 (six) hours as needed for pain.    Historical Provider, MD   BP 110/68 mmHg  Pulse 83  Temp(Src) 98.6 F (37 C) (Oral)  Resp 18  Wt 280 lb (127.007 kg)  SpO2 98%  LMP 08/04/2015 Physical Exam  Constitutional: She appears well-developed and well-nourished.  HENT:  Head: Normocephalic and atraumatic.  Neck: Normal range of motion. Neck supple.  Cardiovascular: Normal rate, regular rhythm and normal heart sounds.  Pulmonary/Chest: Effort normal and breath sounds normal.  Abdominal: Soft. Bowel sounds are normal. She exhibits no distension and no mass. There is no tenderness. There is no rebound and no guarding.  Genitourinary: Rectum normal. Rectal exam shows no external hemorrhoid and no fissure.  Musculoskeletal: Normal range of motion.  Neurological: She is alert.  Skin: Skin is warm and dry.  Psychiatric: She has a normal mood and affect.  Nursing note and vitals reviewed.   ED Course  Procedures (including critical care time) Labs Review Labs Reviewed  URINALYSIS,  ROUTINE W REFLEX MICROSCOPIC (NOT AT [facility])  PREGNANCY, URINE    Imaging Review No results found. I have personally reviewed and evaluated these images and lab results as part of my medical decision-making.   EKG Interpretation None      MDM   Final diagnoses:  None   Patient presents today with a chief complaint of constipation.  Last BM was yesterday, but she reports that the BM was hard in consistency and that she had to strain.  Patient given a fleet enema in the ED with good results.  She reports that the abdominal cramping significantly improved after the BM.  Abdomen soft and non tender on exam.  No signs of anal fissure or hemorrhoid on exam.  Patient stable for discharge.  Return precautions given.   Santiago Glad, PA-C 08/11/15 1025  Glynn Octave, MD 08/11/15 [phone]

## 2015-08-09 NOTE — ED Notes (Signed)
Pt disimpacted , very large amount of solid stool removed , pt then was able to have a BM with large results

## 2015-08-09 NOTE — Discharge Instructions (Signed)
°Emergency Department Resource Guide °1) Find a Doctor and Pay Out of Pocket °Although you won't have to find out who is covered by your insurance plan, it is a good idea to ask around and get recommendations. You will then need to call the office and see if the doctor you have chosen will accept you as a new patient and what types of options they offer for patients who are self-pay. Some doctors offer discounts or will set up payment plans for their patients who do not have insurance, but you will need to ask so you aren't surprised when you get to your appointment. ° °2) Contact Your Local Health Department °Not all health departments have doctors that can see patients for sick visits, but many do, so it is worth a call to see if yours does. If you don't know where your local health department is, you can check in your phone book. The CDC also has a tool to help you locate your state's health department, and many state websites also have listings of all of their local health departments. ° °3) Find a Walk-in Clinic °If your illness is not likely to be very severe or complicated, you may want to try a walk in clinic. These are popping up all over the country in pharmacies, drugstores, and shopping centers. They're usually staffed by nurse practitioners or physician assistants that have been trained to treat common illnesses and complaints. They're usually fairly quick and inexpensive. However, if you have serious medical issues or chronic medical problems, these are probably not your best option. ° °No Primary Care Doctor: °- Call [facility] at  832-8000 - they can help you locate a primary care doctor that  accepts your insurance, provides certain services, etc. °- Physician Referral Service- 1-800-533-3463 ° °Chronic Pain Problems: °Organization         Address  Phone   Notes  °[facility]  (336) 297-2271 Patients need to be referred by their primary care doctor.  ° °Medication  Assistance: °Organization         Address  Phone   Notes  °[place] [facility] [address] °[place], [place] [address] (336) 641-8030 --Must be a resident of [place] °-- Must have NO insurance coverage whatsoever (no Medicaid/ Medicare, etc.) °-- The pt. MUST have a primary care doctor that directs their care regularly and follows them in the community °  °[facility]  (866) 331-1348   °[employer]  (888) 892-1162   ° °Agencies that provide inexpensive medical care: °Organization         Address  Phone   Notes  °[facility]  (336) 832-8035   °[facility]    (336) 832-7272   °[facility] [address] °[place], [place] [address] (336) 832-4777   °[facility] [address], °[place] (336) 271-4999   °[facility]    (336) 373-0678   °[facility]    (336) 272-1050   °[facility] ° [address], [place] Phone:  (336) 832-4444, Fax:  (336) 832-4440 Hours of Operation:  9 am - 6 pm, M-F.  Also accepts Medicaid/Medicare and self-pay.  °[facility] ° [address],  Phone: (336) 832-3150, Fax: (336) 832-3151. Hours of Operation:  8:30 am - 5:30 pm, M-F.  Also accepts Medicaid and self-pay.  °[facility] [place] [address], [place] Phone: (336) 878-6027   °[facility] [address], [place], [place] (336)723-1848, Ext. 123 Mondays & Thursdays: 7-9 AM.  First 15 patients are seen on a first come, first serve basis. °  ° °Medicaid-accepting [place] Providers: ° °Organization         Address  Phone   Notes  °[facility] [address], [place] (336) 641-2100 Also accepts self-pay patients.  °[facility] [address], [place] ° (336) 856-9996   °[facility] [address], [place]  (336) 288-8857   °[facility] [address] [place] [address], [place] (336) 299-7000   °Veita Bland [address], [place]  ° (336) 373-1557 Only accepts [place] Access Medicaid patients after they have their name applied to their card.  ° °Self-Pay (no insurance) in [place]: ° °Organization         Address  Phone   Notes  °Sickle Cell Patients, [facility] [address], [place] (336) 832-1970   °[facility] [address], [place] (336) 832-4400   °[facility] ° [address], [place] (336) 992-4800   °[facility]/Dr. Osei-Bonsu ° [address] [place] [address], [place] or [address], [place] (336) [phone] Phone number for both [place] and [place] locations is the same.  °[facility] [address], [place] (336) 299-0000   °[facility] [address] [place] [address], [place] or [address] (336) 852-7530 °(336) 878-2260   °[facility] [address], [place] (336) 350-1642, phone; (336) 294-5005, fax Sees patients 1st and 3rd Saturday of every month.  Must not qualify for public or private insurance (i.e. Medicaid, Medicare, [place] Health Choice, Veterans' Benefits) • Household income should be no more than 200% of the poverty level •The clinic cannot treat you if you are pregnant or think you are pregnant • Sexually transmitted diseases are not treated at the clinic.  ° ° °Dental Care: °Organization         Address  Phone  Notes  °[place] [facility] [address], [place] (336) 641-6152 Accepts children up to age 21 who are enrolled in Medicaid or [place] Health Choice; pregnant women with a Medicaid card; and children who have applied for Medicaid or [place] Health Choice, but were declined, whose parents can pay a reduced fee at time of service.  °[place]  [facility] [place]  [address], [place] (336) 641-7733 Accepts children up to age 21 who are enrolled in Medicaid or [place] Health Choice; pregnant women with a Medicaid card; and children who have applied for Medicaid or  Health Choice, but were declined, whose parents can pay a reduced fee at time of service.  °[facility] ° [address],  (336) 641-4533 Patients are seen by appointment only. Walk-ins are not accepted. [facility] will see patients 18 years of age and older. °Monday - Tuesday (8am-5pm) °Most Wednesdays (8:30-5pm) °$30 per visit, cash only  °[facility] ° [address], [place] (336) 641-4533 Patients are seen by appointment only. Walk-ins are not accepted. [facility] will see patients 18 years of age and older. °One   Wednesday Evening (Monthly: Volunteer Based).  $30 per visit, cash only  °[facility]  (919) 537-3737 for adults; Children under age 4, call Graduate Pediatric Dentistry at (919) [phone]. Children aged 4-14, please call (919) [phone] to request a pediatric application. ° Dental services are provided in all areas of dental care including fillings, crowns and bridges, complete and partial dentures, implants, gum treatment, root canals, and extractions. Preventive care is also provided. Treatment is provided to both adults and children. °Patients are selected via a lottery and there is often a waiting list. °  °[facility] [address], °[place] ° (336) 763-8833 [email or web address] °  °[facility] [address], [place], [place] (336)723-1848, Ext. 123 Second and Fourth Thursday of each month, opens at 6:30 AM; Clinic ends at 9 AM.  Patients are seen on a first-come first-served basis, and a limited number are seen during each clinic.  ° °[facility] ° [address], [place], [place] (336) 723-7904    Eligibility Requirements °You must have lived in [place], [place], or [place] counties for at least the last three months. °  You cannot be eligible for state or federal sponsored healthcare insurance, including Veterans Administration, Medicaid, or Medicare. °  You generally cannot be eligible for healthcare insurance through your employer.  °  How to apply: °Eligibility screenings are held every Tuesday and Wednesday afternoon from 1:00 pm until 4:00 pm. You do not need an appointment for the interview!  °[facility] [address], [place], [place] 336-631-2330   °[facility]  336-342-8273   °[facility]  336-703-3100   °[facility]  336-570-6415   ° °Behavioral Health Resources in the Community: °Intensive Outpatient Programs °Organization         Address  Phone  Notes  °[facility] [address], [place], [place] 336-878-6098   °[facility] [address], [place], [place] 336-832-9800   °[facility] [address], [place], [place] ° 336-882-2125   °[facility] [address],  °[place], [place] 1-800-853-5163 or 336-641-4981   °Substance Abuse Resources °Organization         Address  Phone  Notes  °[facility]  336-882-2125   °[facility]  336-784-9470   °[facility]  336-285-9073   °[facility]  336-845-3988   °Residential & Outpatient Substance Abuse Program  1-800-659-3381   °Psychological Services °Organization         Address  Phone  Notes  °[facility]  336- 832-9600   °[facility]  336- 378-7881   °[facility] [address], [place] 1-800-853-5163 or 336-641-4981   ° °Mobile Crisis Teams °Organization         Address  Phone  Notes  °[facility], Mobile Crisis Care Unit  1-877-626-1772   °Assertive °[facility] ° [address]  [place], [place] 336-834-9664   °Sharon DeEsch [address] °[place] 336-554-5454   ° °Self-Help/Support Groups °Organization         Address  Phone             Notes  °[facility]  - variety of support groups  336- 373-1402 Call for more information  °Narcotics Anonymous (NA), [facility] [address], °[place]   2 meetings at this location  ° °  Residential Treatment Programs °Organization         Address  Phone  Notes  °[facility] [address],    °[place]  1-866-801-8205   °[facility] ° [address], [place], [place] 704-293-8524   °[facility] [address], [place] 336-845-3988 Admissions: 8am-3pm M-F  °[facility] [address]    °[place], [place] 336-841-1104   °[facility] [address], [place], [place] 336-379-7146   °[facility] [address]  °[place], [place] 336-285-9073   °[facility] Intensive Outpatient [address], [place], [place] 336-852-3033   °[facility] [address]  °[place], [place] 1-877-615-2722 or 336-784-9470   °[facility] [address] [place], [place] 336-227-7417 Accepts Medicaid  °[facility] [address]  °[place] 1-800-659-3381 Substance Abuse/Addiction Treatment  ° °[place] Behavioral Health Resources °Organization         Address  Phone  Notes  °[facility]  (888) 581-9988   °Julie Brannon, PhD [address] [place], [place]   (336) 349-5553 or (336) 951-0000   °[facility]   [address] °[place], [place] (336) 349-4454   °[facility] [address], [place], [place] (336) 342-8316 Insurance/Medicaid/sponsorship through [facility]  °[facility] [address]                                    [place], [place] (336) 342-8316 Therapy/tele-psych/case    °[facility] [address]  ° [place], [place] (336) 349-2233    °Dr. Arfeen  (336) 349-4544   °[facility] [place]  [employer] [place] [facility] 1) [address], [place] °2) [address], [place] °3)  [address], [place] (336) 349-3220 °(336) 342-7768 ° °(336) 342-8140   °[place] Child Abuse [facility] (336) 342-1394 or (336) 342-3537 (After Hours)    ° ° °

## 2015-11-10 ENCOUNTER — Emergency Department (HOSPITAL_BASED_OUTPATIENT_CLINIC_OR_DEPARTMENT_OTHER)
Admission: EM | Admit: 2015-11-10 | Discharge: 2015-11-10 | Disposition: A | Discharge status: Home / Self Care | Payer: Self-pay | Attending: Emergency Medicine | Admitting: Emergency Medicine

## 2015-11-10 ENCOUNTER — Encounter (HOSPITAL_BASED_OUTPATIENT_CLINIC_OR_DEPARTMENT_OTHER): Payer: Self-pay | Admitting: Emergency Medicine

## 2015-11-10 DIAGNOSIS — Z793 Long term (current) use of hormonal contraceptives: Secondary | ICD-10-CM | POA: Insufficient documentation

## 2015-11-10 DIAGNOSIS — Z792 Long term (current) use of antibiotics: Secondary | ICD-10-CM | POA: Insufficient documentation

## 2015-11-10 DIAGNOSIS — Z8679 Personal history of other diseases of the circulatory system: Secondary | ICD-10-CM | POA: Insufficient documentation

## 2015-11-10 DIAGNOSIS — K219 Gastro-esophageal reflux disease without esophagitis: Secondary | ICD-10-CM | POA: Insufficient documentation

## 2015-11-10 DIAGNOSIS — Z7952 Long term (current) use of systemic steroids: Secondary | ICD-10-CM | POA: Insufficient documentation

## 2015-11-10 DIAGNOSIS — M25561 Pain in right knee: Secondary | ICD-10-CM | POA: Insufficient documentation

## 2015-11-10 DIAGNOSIS — Z7951 Long term (current) use of inhaled steroids: Secondary | ICD-10-CM | POA: Insufficient documentation

## 2015-11-10 DIAGNOSIS — F17210 Nicotine dependence, cigarettes, uncomplicated: Secondary | ICD-10-CM | POA: Insufficient documentation

## 2015-11-10 DIAGNOSIS — F419 Anxiety disorder, unspecified: Secondary | ICD-10-CM | POA: Insufficient documentation

## 2015-11-10 DIAGNOSIS — M25562 Pain in left knee: Secondary | ICD-10-CM | POA: Insufficient documentation

## 2015-11-10 DIAGNOSIS — J45909 Unspecified asthma, uncomplicated: Secondary | ICD-10-CM | POA: Insufficient documentation

## 2015-11-10 DIAGNOSIS — Z872 Personal history of diseases of the skin and subcutaneous tissue: Secondary | ICD-10-CM | POA: Insufficient documentation

## 2015-11-10 MED ORDER — IBUPROFEN 800 MG PO TABS: ORAL_TABLET | ORAL | Status: DC

## 2015-11-10 MED ORDER — HYDROCODONE-ACETAMINOPHEN 5-325 MG PO TABS: 1.0000 | ORAL_TABLET | Freq: Four times a day (QID) | ORAL | Status: DC | PRN

## 2015-11-10 NOTE — ED Provider Notes (Signed)
CSN: [id number]     Arrival date & time 11/10/15  0431 History   First MD Initiated Contact with Patient 11/10/15 0448     Chief Complaint  Patient presents with  . Leg Pain     (Consider location/radiation/quality/duration/timing/severity/associated sxs/prior Treatment) HPI This is a 29 year old female with about a one-month history of pain in her knees. The pain is primarily in the anterior lateral aspects of the knees but when she walks there is more diffuse pain in the anterior aspects of the knees. The right knee is worse than the left. There is no pain in the popliteal fossae or deep in the joints. She describes the pain as feeling like a tightness. It is exacerbated by movement and certain positions. She has been to her PCP who suspects arthritis and wants her to get into physical therapy. She took a hydrocodone tablet earlier with some relief.  Past Medical History  Diagnosis Date  . Eczema   . GERD (gastroesophageal reflux disease)   . Asthma   . Hiatal hernia   . Anxiety   . Migraines    Past Surgical History  Procedure Laterality Date  . Tonsillectomy    . Cholecystectomy     No family history on file. Social History  Substance Use Topics  . Smoking status: Current Some Day Smoker    Types: Cigarettes  . Smokeless tobacco: None  . Alcohol Use: Yes     Comment: occ   OB History    No data available     Review of Systems  All other systems reviewed and are negative.   Allergies  Review of patient's allergies indicates no known allergies.  Home Medications   Prior to Admission medications   Medication Sig Start Date End Date Taking? Authorizing Provider  acetaZOLAMIDE (DIAMOX) 250 MG tablet Take 250 mg by mouth 2 (two) times daily.    Historical Provider, MD  ALBUTEROL SULFATE HFA IN Inhale into the lungs.    Historical Provider, MD  amoxicillin-clavulanate (AUGMENTIN) 250-125 MG per tablet Take 1 tablet by mouth 3 (three) times daily.    Historical  Provider, MD  docusate sodium (COLACE) 100 MG capsule Take 1 capsule (100 mg total) by mouth every 12 (twelve) hours. 08/09/15   Heather Laisure, PA-C  Esomeprazole Magnesium (NEXIUM PO) Take by mouth.    Historical Provider, MD  Fexofenadine HCl (ALLEGRA PO) Take by mouth.    Historical Provider, MD  fluticasone (FLONASE) 50 MCG/ACT nasal spray Place 2 sprays into the nose daily.    Historical Provider, MD  LORazepam (ATIVAN) 1 MG tablet Take 1 mg by mouth 2 (two) times daily.    Historical Provider, MD  norgestrel-ethinyl estradiol (LO/OVRAL,CRYSELLE) 0.3-30 MG-MCG tablet Take 1 tablet by mouth daily.    Historical Provider, MD  polyethylene glycol powder (GLYCOLAX/MIRALAX) powder Take 17 g by mouth daily. 08/09/15   Heather Laisure, PA-C  PRAVASTATIN SODIUM PO Take by mouth.    Historical Provider, MD  predniSONE (STERAPRED UNI-PAK) 10 MG tablet Take 10 mg by mouth daily.    Historical Provider, MD  SUMATRIPTAN SUCCINATE PO Take by mouth.    Historical Provider, MD  traMADol (ULTRAM) 50 MG tablet Take 50 mg by mouth every 6 (six) hours as needed for pain.    Historical Provider, MD   BP 126/85 mmHg  Pulse 83  Temp(Src) 98.3 F (36.8 C) (Oral)  Resp 18  Ht 5\' 9"  (1.753 m)  Wt 290 lb (131.543 kg)  BMI 42.81  kg/m2  SpO2 99%  LMP 11/03/2015   Physical Exam  General: Well-developed, well-nourished female in no acute distress; appearance consistent with age of record HENT: normocephalic; atraumatic Eyes: pupils equal, round and reactive to light; extraocular muscles intact Neck: supple Heart: regular rate and rhythm Lungs: clear to auscultation bilaterally Abdomen: soft; nondistended; nontender; bowel sounds present Extremities: No deformity; full range of motion; pulses normal; soft tissue tenderness of the anterior lateral knees bilaterally, right greater than left, no effusions and no pain on ballottement of patellas Neurologic: Awake, alert and oriented; motor function intact in all  extremities and symmetric; no facial droop Skin: Warm and dry Psychiatric: Normal mood and affect    ED Course  Procedures (including critical care time)   MDM  We will treat her symptomatically and refer to sports medicine.     Paula Libra, MD 11/10/15 [phone]

## 2015-11-10 NOTE — ED Notes (Signed)
Pt c/o tightness and pain in bilateral knees for the last month. Per her PCP pt dx with arthritis and was told that she would need Physical Therapy.

## 2015-11-10 NOTE — ED Notes (Signed)
MD at bedside. 

## 2015-11-27 ENCOUNTER — Emergency Department (HOSPITAL_BASED_OUTPATIENT_CLINIC_OR_DEPARTMENT_OTHER): Payer: Self-pay

## 2015-11-27 ENCOUNTER — Emergency Department (HOSPITAL_BASED_OUTPATIENT_CLINIC_OR_DEPARTMENT_OTHER)
Admission: EM | Admit: 2015-11-27 | Discharge: 2015-11-27 | Disposition: A | Discharge status: Home / Self Care | Payer: Self-pay | Attending: Emergency Medicine | Admitting: Emergency Medicine

## 2015-11-27 ENCOUNTER — Encounter (HOSPITAL_BASED_OUTPATIENT_CLINIC_OR_DEPARTMENT_OTHER): Payer: Self-pay

## 2015-11-27 DIAGNOSIS — Z7952 Long term (current) use of systemic steroids: Secondary | ICD-10-CM | POA: Insufficient documentation

## 2015-11-27 DIAGNOSIS — K219 Gastro-esophageal reflux disease without esophagitis: Secondary | ICD-10-CM | POA: Insufficient documentation

## 2015-11-27 DIAGNOSIS — Z872 Personal history of diseases of the skin and subcutaneous tissue: Secondary | ICD-10-CM | POA: Insufficient documentation

## 2015-11-27 DIAGNOSIS — Z8679 Personal history of other diseases of the circulatory system: Secondary | ICD-10-CM | POA: Insufficient documentation

## 2015-11-27 DIAGNOSIS — R0602 Shortness of breath: Secondary | ICD-10-CM

## 2015-11-27 DIAGNOSIS — M25561 Pain in right knee: Secondary | ICD-10-CM | POA: Insufficient documentation

## 2015-11-27 DIAGNOSIS — M25562 Pain in left knee: Secondary | ICD-10-CM | POA: Insufficient documentation

## 2015-11-27 DIAGNOSIS — J45901 Unspecified asthma with (acute) exacerbation: Secondary | ICD-10-CM | POA: Insufficient documentation

## 2015-11-27 DIAGNOSIS — F17210 Nicotine dependence, cigarettes, uncomplicated: Secondary | ICD-10-CM | POA: Insufficient documentation

## 2015-11-27 DIAGNOSIS — Z79899 Other long term (current) drug therapy: Secondary | ICD-10-CM | POA: Insufficient documentation

## 2015-11-27 DIAGNOSIS — F419 Anxiety disorder, unspecified: Secondary | ICD-10-CM | POA: Insufficient documentation

## 2015-11-27 DIAGNOSIS — Z793 Long term (current) use of hormonal contraceptives: Secondary | ICD-10-CM | POA: Insufficient documentation

## 2015-11-27 DIAGNOSIS — Z792 Long term (current) use of antibiotics: Secondary | ICD-10-CM | POA: Insufficient documentation

## 2015-11-27 DIAGNOSIS — Z7951 Long term (current) use of inhaled steroids: Secondary | ICD-10-CM | POA: Insufficient documentation

## 2015-11-27 DIAGNOSIS — E663 Overweight: Secondary | ICD-10-CM | POA: Insufficient documentation

## 2015-11-27 LAB — CBC WITH DIFFERENTIAL/PLATELET
BASOS ABS: 0 10*3/uL (ref 0.0–0.1)
Basophils Relative: 0 %
EOS ABS: 0.1 10*3/uL (ref 0.0–0.7)
Eosinophils Relative: 1 %
HCT: 37.5 % (ref 36.0–46.0)
Hemoglobin: 11.5 g/dL — ABNORMAL LOW (ref 12.0–15.0)
LYMPHS PCT: 34 %
Lymphs Abs: 2.6 10*3/uL (ref 0.7–4.0)
MCH: 26.3 pg (ref 26.0–34.0)
MCHC: 30.7 g/dL (ref 30.0–36.0)
MCV: 85.6 fL (ref 78.0–100.0)
Monocytes Absolute: 0.5 10*3/uL (ref 0.1–1.0)
Monocytes Relative: 6 %
NEUTROS PCT: 59 %
Neutro Abs: 4.5 10*3/uL (ref 1.7–7.7)
PLATELETS: 266 10*3/uL (ref 150–400)
RBC: 4.38 MIL/uL (ref 3.87–5.11)
RDW: 13 % (ref 11.5–15.5)
WBC: 7.7 10*3/uL (ref 4.0–10.5)

## 2015-11-27 LAB — BASIC METABOLIC PANEL
ANION GAP: 4 — AB (ref 5–15)
BUN: 8 mg/dL (ref 6–20)
CO2: 28 mmol/L (ref 22–32)
Calcium: 8.7 mg/dL — ABNORMAL LOW (ref 8.9–10.3)
Chloride: 106 mmol/L (ref 101–111)
Creatinine, Ser: 0.85 mg/dL (ref 0.44–1.00)
Glucose, Bld: 90 mg/dL (ref 65–99)
POTASSIUM: 3.6 mmol/L (ref 3.5–5.1)
SODIUM: 138 mmol/L (ref 135–145)

## 2015-11-27 LAB — D-DIMER, QUANTITATIVE (NOT AT ARMC): D DIMER QUANT: 0.34 ug{FEU}/mL (ref 0.00–0.50)

## 2015-11-27 LAB — TROPONIN I

## 2015-11-27 MED ORDER — NAPROXEN 500 MG PO TABS: 500.0000 mg | ORAL_TABLET | Freq: Two times a day (BID) | ORAL | Status: DC

## 2015-11-27 NOTE — ED Notes (Signed)
Pt states at work, developed SOB around an hour ago with pain to center lt chest down lt arm, swelling to rt leg; no distress noted

## 2015-11-27 NOTE — ED Provider Notes (Signed)
CSN: [id number]     Arrival date & time 11/27/15  1914 History   First MD Initiated Contact with Patient 11/27/15 0539     Chief Complaint  Patient presents with  . Shortness of Breath     (Consider location/radiation/quality/duration/timing/severity/associated sxs/prior Treatment) HPI  This a 29 year old female with history of asthma and anxiety who presents with shortness of breath. Patient reports acute onset of shortness of breath while at work earlier today. She had associated chest tightness that radiated into her left arm. She reports that she was just sitting. She did not feel anxious. She denies any recent fevers or cough. Currently she denies any chest pain. Patient is on birth control. She denies any history of blood clots, recent hospitalization, recent long travel. Patient also reports persistent knee pain, bilateral. She was seen in over one month ago for the same. At that time she been having pain for over a month. She has taken hydrocodone intermittently which has helped. She has not been able to get into a sports medicine doctor. It is worse when she is on her feet all day.  Past Medical History  Diagnosis Date  . Eczema   . GERD (gastroesophageal reflux disease)   . Asthma   . Hiatal hernia   . Anxiety   . Migraines    Past Surgical History  Procedure Laterality Date  . Tonsillectomy    . Cholecystectomy     No family history on file. Social History  Substance Use Topics  . Smoking status: Current Some Day Smoker    Types: Cigarettes  . Smokeless tobacco: None  . Alcohol Use: Yes     Comment: occ   OB History    No data available     Review of Systems  Constitutional: Negative for fever.  Respiratory: Positive for chest tightness and shortness of breath. Negative for cough.   Cardiovascular: Positive for leg swelling. Negative for chest pain.  Gastrointestinal: Negative for nausea, vomiting and abdominal pain.  Genitourinary: Negative for dysuria.   Musculoskeletal: Negative for back pain.       Bilateral knee pain  Neurological: Negative for headaches.  All other systems reviewed and are negative.     Allergies  Review of patient's allergies indicates no known allergies.  Home Medications   Prior to Admission medications   Medication Sig Start Date End Date Taking? Authorizing Provider  acetaZOLAMIDE (DIAMOX) 250 MG tablet Take 250 mg by mouth 2 (two) times daily.    Historical Provider, MD  ALBUTEROL SULFATE HFA IN Inhale into the lungs.    Historical Provider, MD  amoxicillin-clavulanate (AUGMENTIN) 250-125 MG per tablet Take 1 tablet by mouth 3 (three) times daily.    Historical Provider, MD  docusate sodium (COLACE) 100 MG capsule Take 1 capsule (100 mg total) by mouth every 12 (twelve) hours. 08/09/15   Heather Laisure, PA-C  Esomeprazole Magnesium (NEXIUM PO) Take by mouth.    Historical Provider, MD  Fexofenadine HCl (ALLEGRA PO) Take by mouth.    Historical Provider, MD  fluticasone (FLONASE) 50 MCG/ACT nasal spray Place 2 sprays into the nose daily.    Historical Provider, MD  HYDROcodone-acetaminophen (NORCO/VICODIN) 5-325 MG tablet Take 1-2 tablets by mouth every 6 (six) hours as needed for severe pain. 11/10/15   John Molpus, MD  ibuprofen (ADVIL,MOTRIN) 800 MG tablet Take 1 tablet 3 times daily with a meal as needed for knee pain. 11/10/15   John Molpus, MD  LORazepam (ATIVAN) 1 MG tablet Take  1 mg by mouth 2 (two) times daily.    Historical Provider, MD  norgestrel-ethinyl estradiol (LO/OVRAL,CRYSELLE) 0.3-30 MG-MCG tablet Take 1 tablet by mouth daily.    Historical Provider, MD  polyethylene glycol powder (GLYCOLAX/MIRALAX) powder Take 17 g by mouth daily. 08/09/15   Heather Laisure, PA-C  PRAVASTATIN SODIUM PO Take by mouth.    Historical Provider, MD  predniSONE (STERAPRED UNI-PAK) 10 MG tablet Take 10 mg by mouth daily.    Historical Provider, MD  SUMATRIPTAN SUCCINATE PO Take by mouth.    Historical Provider, MD    BP 118/84 mmHg  Pulse 92  Temp(Src) 98.9 F (37.2 C) (Oral)  SpO2 100%  LMP 11/26/2015 Physical Exam  Constitutional: She is oriented to person, place, and time. She appears well-developed and well-nourished.  Overweight  HENT:  Head: Normocephalic and atraumatic.  Cardiovascular: Normal rate, regular rhythm and normal heart sounds.   Pulmonary/Chest: Effort normal and breath sounds normal. No respiratory distress. She has no wheezes.  Abdominal: Soft. Bowel sounds are normal. There is no tenderness. There is no rebound.  Musculoskeletal:  Normal range of motion of the bilaterally knees, crepitus noticed right greater than left, no tenderness palpation of the joint line, no overlying skin changes  Neurological: She is alert and oriented to person, place, and time.  Skin: Skin is warm and dry.  Psychiatric: She has a normal mood and affect.  Nursing note and vitals reviewed.   ED Course  Procedures (including critical care time) Labs Review Labs Reviewed  CBC WITH DIFFERENTIAL/PLATELET - Abnormal; Notable for the following:    Hemoglobin 11.5 (*)    All other components within normal limits  BASIC METABOLIC PANEL - Abnormal; Notable for the following:    Calcium 8.7 (*)    Anion gap 4 (*)    All other components within normal limits  TROPONIN I  D-DIMER, QUANTITATIVE (NOT AT [facility])    Imaging Review No results found. I have personally reviewed and evaluated these images and lab results as part of my medical decision-making.   EKG Interpretation   Date/Time:  Saturday November 27 2015 06:07:49 EST Ventricular Rate:  75 PR Interval:  156 QRS Duration: 88 QT Interval:  360 QTC Calculation: 402 R Axis:   65 Text Interpretation:  Sinus rhythm Confirmed by HORTON  MD, COURTNEY  ([id number]) on 11/27/2015 6:47:24 AM      MDM   Final diagnoses:  Shortness of breath  Knee pain, bilateral     Patient presents with shortness of breath and chest pain. Onset one hour  prior to arrival. Nontoxic on exam. Only risk factor for blood clot is worse control use. She is nontoxic. Pulmonary exam is reassuring. EKG is nonischemic and normal.  Troponin and d-dimer are negative. Chest x-ray is negative. Regarding patient's knee pain, this appears chronic in nature. She's had a prior visit for the same. Discussed with patient continued supportive measures and follow-up with sports medicine. Also discussed with patient weight loss. Patient stated understanding.  After history, exam, and medical workup I feel the patient has been appropriately medically screened and is safe for discharge home. Pertinent diagnoses were discussed with the patient. Patient was given return precautions.     Shon Batonourtney F Horton, MD 11/28/15 [phone]

## 2015-11-27 NOTE — Discharge Instructions (Signed)
You were seen today for several complaints. Regarding her shortness of breath, your chest x-ray and other workup is reassuring. Your screening for blood clots and your EKG is reassuring. Regarding her knee pain, this appears chronic in nature. Given that it is bilateral that is likely related to activity and weight. Continue compression, ice, and anti-inflammatories. Follow-up as previously directed.  Knee Pain Knee pain is a very common symptom and can have many causes. Knee pain often goes away when you follow your health care provider's instructions for relieving pain and discomfort at home. However, knee pain can develop into a condition that needs treatment. Some conditions may include:  Arthritis caused by wear and tear (osteoarthritis).  Arthritis caused by swelling and irritation (rheumatoid arthritis or gout).  A cyst or growth in your knee.  An infection in your knee joint.  An injury that will not heal.  Damage, swelling, or irritation of the tissues that support your knee (torn ligaments or tendinitis). If your knee pain continues, additional tests may be ordered to diagnose your condition. Tests may include X-rays or other imaging studies of your knee. You may also need to have fluid removed from your knee. Treatment for ongoing knee pain depends on the cause, but treatment may include:  Medicines to relieve pain or swelling.  Steroid injections in your knee.  Physical therapy.  Surgery. HOME CARE INSTRUCTIONS  Take medicines only as directed by your health care provider.  Rest your knee and keep it raised (elevated) while you are resting.  Do not do things that cause or worsen pain.  Avoid high-impact activities or exercises, such as running, jumping rope, or doing jumping jacks.  Apply ice to the knee area:  Put ice in a plastic bag.  Place a towel between your skin and the bag.  Leave the ice on for 20 minutes, 2-3 times a day.  Ask your health care provider  if you should wear an elastic knee support.  Keep a pillow under your knee when you sleep.  Lose weight if you are overweight. Extra weight can put pressure on your knee.  Do not use any tobacco products, including cigarettes, chewing tobacco, or electronic cigarettes. If you need help quitting, ask your health care provider. Smoking may slow the healing of any bone and joint problems that you may have. SEEK MEDICAL CARE IF:  Your knee pain continues, changes, or gets worse.  You have a fever along with knee pain.  Your knee buckles or locks up.  Your knee becomes more swollen. SEEK IMMEDIATE MEDICAL CARE IF:   Your knee joint feels hot to the touch.  You have chest pain or trouble breathing.   This information is not intended to replace advice given to you by your health care provider. Make sure you discuss any questions you have with your health care provider.   Document Released: 09/17/2007 Document Revised: 12/11/2014 Document Reviewed: 07/06/2014 Elsevier Interactive Patient Education 2016 [employer] Shortness of Breath Shortness of breath means you have trouble breathing. It could also mean that you have a medical problem. You should get immediate medical care for shortness of breath. CAUSES   Not enough oxygen in the air such as with high altitudes or a smoke-filled room.  Certain lung diseases, infections, or problems.  Heart disease or conditions, such as angina or heart failure.  Low red blood cells (anemia).  Poor physical fitness, which can cause shortness of breath when you exercise.  Chest or back injuries  or stiffness.  Being overweight.  Smoking.  Anxiety, which can make you feel like you are not getting enough air. DIAGNOSIS  Serious medical problems can often be found during your physical exam. Tests may also be done to determine why you are having shortness of breath. Tests may include:  Chest X-rays.  Lung function tests.  Blood  tests.  An electrocardiogram (ECG).  An ambulatory electrocardiogram. An ambulatory ECG records your heartbeat patterns over a 24-hour period.  Exercise testing.  A transthoracic echocardiogram (TTE). During echocardiography, sound waves are used to evaluate how blood flows through your heart.  A transesophageal echocardiogram (TEE).  Imaging scans. Your health care provider may not be able to find a cause for your shortness of breath after your exam. In this case, it is important to have a follow-up exam with your health care provider as directed.  TREATMENT  Treatment for shortness of breath depends on the cause of your symptoms and can vary greatly. HOME CARE INSTRUCTIONS   Do not smoke. Smoking is a common cause of shortness of breath. If you smoke, ask for help to quit.  Avoid being around chemicals or things that may bother your breathing, such as paint fumes and dust.  Rest as needed. Slowly resume your usual activities.  If medicines were prescribed, take them as directed for the full length of time directed. This includes oxygen and any inhaled medicines.  Keep all follow-up appointments as directed by your health care provider. SEEK MEDICAL CARE IF:   Your condition does not improve in the time expected.  You have a hard time doing your normal activities even with rest.  You have any new symptoms. SEEK IMMEDIATE MEDICAL CARE IF:   Your shortness of breath gets worse.  You feel light-headed, faint, or develop a cough not controlled with medicines.  You start coughing up blood.  You have pain with breathing.  You have chest pain or pain in your arms, shoulders, or abdomen.  You have a fever.  You are unable to walk up stairs or exercise the way you normally do. MAKE SURE YOU:  Understand these instructions.  Will watch your condition.  Will get help right away if you are not doing well or get worse.   This information is not intended to replace advice  given to you by your health care provider. Make sure you discuss any questions you have with your health care provider.   Document Released: 08/15/2001 Document Revised: 11/25/2013 Document Reviewed: 02/05/2012 Elsevier Interactive Patient Education [employer]

## 2015-12-23 ENCOUNTER — Ambulatory Visit: Payer: Self-pay | Admitting: Family Medicine

## 2015-12-27 ENCOUNTER — Ambulatory Visit: Payer: Self-pay | Admitting: Family Medicine

## 2015-12-29 ENCOUNTER — Ambulatory Visit: Payer: Self-pay | Admitting: Family Medicine

## 2016-02-26 ENCOUNTER — Encounter (HOSPITAL_BASED_OUTPATIENT_CLINIC_OR_DEPARTMENT_OTHER): Payer: Self-pay | Admitting: *Deleted

## 2016-02-26 ENCOUNTER — Emergency Department (HOSPITAL_BASED_OUTPATIENT_CLINIC_OR_DEPARTMENT_OTHER)
Admission: EM | Admit: 2016-02-26 | Discharge: 2016-02-26 | Disposition: A | Discharge status: Home / Self Care | Payer: BLUE CROSS/BLUE SHIELD | Attending: Emergency Medicine | Admitting: Emergency Medicine

## 2016-02-26 DIAGNOSIS — J111 Influenza due to unidentified influenza virus with other respiratory manifestations: Secondary | ICD-10-CM | POA: Insufficient documentation

## 2016-02-26 DIAGNOSIS — K219 Gastro-esophageal reflux disease without esophagitis: Secondary | ICD-10-CM | POA: Diagnosis not present

## 2016-02-26 DIAGNOSIS — Z792 Long term (current) use of antibiotics: Secondary | ICD-10-CM | POA: Insufficient documentation

## 2016-02-26 DIAGNOSIS — Z791 Long term (current) use of non-steroidal anti-inflammatories (NSAID): Secondary | ICD-10-CM | POA: Diagnosis not present

## 2016-02-26 DIAGNOSIS — M791 Myalgia: Secondary | ICD-10-CM | POA: Diagnosis present

## 2016-02-26 DIAGNOSIS — Z8679 Personal history of other diseases of the circulatory system: Secondary | ICD-10-CM | POA: Diagnosis not present

## 2016-02-26 DIAGNOSIS — Z79899 Other long term (current) drug therapy: Secondary | ICD-10-CM | POA: Insufficient documentation

## 2016-02-26 DIAGNOSIS — F17210 Nicotine dependence, cigarettes, uncomplicated: Secondary | ICD-10-CM | POA: Insufficient documentation

## 2016-02-26 DIAGNOSIS — H9203 Otalgia, bilateral: Secondary | ICD-10-CM | POA: Insufficient documentation

## 2016-02-26 DIAGNOSIS — F419 Anxiety disorder, unspecified: Secondary | ICD-10-CM | POA: Diagnosis not present

## 2016-02-26 DIAGNOSIS — Z7952 Long term (current) use of systemic steroids: Secondary | ICD-10-CM | POA: Diagnosis not present

## 2016-02-26 DIAGNOSIS — Z793 Long term (current) use of hormonal contraceptives: Secondary | ICD-10-CM | POA: Diagnosis not present

## 2016-02-26 DIAGNOSIS — Z7951 Long term (current) use of inhaled steroids: Secondary | ICD-10-CM | POA: Diagnosis not present

## 2016-02-26 DIAGNOSIS — M79652 Pain in left thigh: Secondary | ICD-10-CM | POA: Insufficient documentation

## 2016-02-26 DIAGNOSIS — Z3202 Encounter for pregnancy test, result negative: Secondary | ICD-10-CM | POA: Insufficient documentation

## 2016-02-26 DIAGNOSIS — J45909 Unspecified asthma, uncomplicated: Secondary | ICD-10-CM | POA: Insufficient documentation

## 2016-02-26 DIAGNOSIS — R69 Illness, unspecified: Secondary | ICD-10-CM

## 2016-02-26 LAB — URINALYSIS, ROUTINE W REFLEX MICROSCOPIC
BILIRUBIN URINE: NEGATIVE
Glucose, UA: NEGATIVE mg/dL
HGB URINE DIPSTICK: NEGATIVE
KETONES UR: NEGATIVE mg/dL
Leukocytes, UA: NEGATIVE
NITRITE: NEGATIVE
PH: 6 (ref 5.0–8.0)
Protein, ur: NEGATIVE mg/dL
SPECIFIC GRAVITY, URINE: 1.026 (ref 1.005–1.030)

## 2016-02-26 LAB — PREGNANCY, URINE: Preg Test, Ur: NEGATIVE

## 2016-02-26 MED ORDER — NAPROXEN 500 MG PO TABS: 500.0000 mg | ORAL_TABLET | Freq: Two times a day (BID) | ORAL | Status: DC | PRN

## 2016-02-26 MED ORDER — NAPROXEN 250 MG PO TABS
500.0000 mg | ORAL_TABLET | Freq: Once | ORAL | Status: AC
  Administered 2016-02-26: 500 mg via ORAL
  Filled 2016-02-26: qty 2

## 2016-02-26 NOTE — ED Provider Notes (Signed)
CSN: [id number]     Arrival date & time 02/26/16  0330 History   First MD Initiated Contact with Patient 02/26/16 [phone]     Chief Complaint  Patient presents with  . Generalized Body Aches     (Consider location/radiation/quality/duration/timing/severity/associated sxs/prior Treatment) HPI  This is a 30 year old female who is complaining of generalized body aches, nausea and subjective fever since yesterday. She took ondansetron with relief of nausea, but continues to have pain. She is also complaining of bilateral ear pain and pain in her left anterior thigh. Her temperature was noted to be 99.1 on arrival. She denies nasal congestion, sore throat, cough, shortness of breath, vomiting or diarrhea.  Past Medical History  Diagnosis Date  . Eczema   . GERD (gastroesophageal reflux disease)   . Asthma   . Hiatal hernia   . Anxiety   . Migraines    Past Surgical History  Procedure Laterality Date  . Tonsillectomy    . Cholecystectomy     No family history on file. Social History  Substance Use Topics  . Smoking status: Current Some Day Smoker    Types: Cigarettes  . Smokeless tobacco: None  . Alcohol Use: Yes     Comment: occ   OB History    No data available     Review of Systems  All other systems reviewed and are negative.   Allergies  Review of patient's allergies indicates no known allergies.  Home Medications   Prior to Admission medications   Medication Sig Start Date End Date Taking? Authorizing Provider  acetaZOLAMIDE (DIAMOX) 250 MG tablet Take 250 mg by mouth 2 (two) times daily.    Historical Provider, MD  ALBUTEROL SULFATE HFA IN Inhale into the lungs.    Historical Provider, MD  amoxicillin-clavulanate (AUGMENTIN) 250-125 MG per tablet Take 1 tablet by mouth 3 (three) times daily.    Historical Provider, MD  docusate sodium (COLACE) 100 MG capsule Take 1 capsule (100 mg total) by mouth every 12 (twelve) hours. 08/09/15   Heather Laisure, PA-C   Esomeprazole Magnesium (NEXIUM PO) Take by mouth.    Historical Provider, MD  Fexofenadine HCl (ALLEGRA PO) Take by mouth.    Historical Provider, MD  fluticasone (FLONASE) 50 MCG/ACT nasal spray Place 2 sprays into the nose daily.    Historical Provider, MD  HYDROcodone-acetaminophen (NORCO/VICODIN) 5-325 MG tablet Take 1-2 tablets by mouth every 6 (six) hours as needed for severe pain. 11/10/15   Carina Chaplin, MD  ibuprofen (ADVIL,MOTRIN) 800 MG tablet Take 1 tablet 3 times daily with a meal as needed for knee pain. 11/10/15   Raela Bohl, MD  LORazepam (ATIVAN) 1 MG tablet Take 1 mg by mouth 2 (two) times daily.    Historical Provider, MD  naproxen (NAPROSYN) 500 MG tablet Take 1 tablet (500 mg total) by mouth 2 (two) times daily. 11/27/15   Shon Batonourtney F Horton, MD  norgestrel-ethinyl estradiol (LO/OVRAL,CRYSELLE) 0.3-30 MG-MCG tablet Take 1 tablet by mouth daily.    Historical Provider, MD  polyethylene glycol powder (GLYCOLAX/MIRALAX) powder Take 17 g by mouth daily. 08/09/15   Heather Laisure, PA-C  PRAVASTATIN SODIUM PO Take by mouth.    Historical Provider, MD  predniSONE (STERAPRED UNI-PAK) 10 MG tablet Take 10 mg by mouth daily.    Historical Provider, MD  SUMATRIPTAN SUCCINATE PO Take by mouth.    Historical Provider, MD   BP 116/71 mmHg  Pulse 99  Temp(Src) 99.1 F (37.3 C) (Oral)  Resp 18  Ht  (1.753 m)  Wt 290 lb (131.543 kg)  BMI 42.81 kg/m2  SpO2 98%   Physical Exam  General: Well-developed, well-nourished female in no acute distress; appearance consistent with age of record HENT: normocephalic; atraumatic; TMs normal; pharynx normal Eyes: pupils equal, round and reactive to light; extraocular muscles intact Neck: supple Heart: regular rate and rhythm Lungs: clear to auscultation bilaterally Abdomen: soft; nondistended; nontender; no masses or hepatosplenomegaly; bowel sounds present Extremities: No deformity; full range of motion; pulses normal; tenderness left  anterior thigh Neurologic: Awake, alert and oriented; motor function intact in all extremities and symmetric; no facial droop Skin: Warm and dry Psychiatric: Normal mood and affect    ED Course  Procedures (including critical care time)   MDM  Nursing notes and vitals signs, including pulse oximetry, reviewed.  Summary of this visit's results, reviewed by myself:  Labs:  Results for orders placed or performed during the hospital encounter of 02/26/16 (from the past 24 hour(s))  Urinalysis, Routine w reflex microscopic (not at [facility])     Status: Abnormal   Collection Time: 02/26/16  4:20 AM  Result Value Ref Range   Color, Urine AMBER (A) YELLOW   APPearance CLEAR CLEAR   Specific Gravity, Urine 1.026 1.005 - 1.030   pH 6.0 5.0 - 8.0   Glucose, UA NEGATIVE NEGATIVE mg/dL   Hgb urine dipstick NEGATIVE NEGATIVE   Bilirubin Urine NEGATIVE NEGATIVE   Ketones, ur NEGATIVE NEGATIVE mg/dL   Protein, ur NEGATIVE NEGATIVE mg/dL   Nitrite NEGATIVE NEGATIVE   Leukocytes, UA NEGATIVE NEGATIVE  Pregnancy, urine     Status: None   Collection Time: 02/26/16  4:20 AM  Result Value Ref Range   Preg Test, Ur NEGATIVE NEGATIVE   Symptoms consistent with early influenza-like illness.     Paula Libra, MD 02/26/16 [phone]

## 2016-02-26 NOTE — ED Notes (Signed)
C/o body aches, nausea(took something at home for nausea), bilateral ear pain  Onset last pm  Left leg pain

## 2016-02-26 NOTE — ED Notes (Signed)
C/o body aches, bilateral ear pain left leg pain and nausea onset last pm

## 2016-02-26 NOTE — Discharge Instructions (Signed)

## 2016-03-28 ENCOUNTER — Ambulatory Visit: Payer: BLUE CROSS/BLUE SHIELD | Admitting: Family Medicine

## 2016-07-13 ENCOUNTER — Emergency Department (HOSPITAL_BASED_OUTPATIENT_CLINIC_OR_DEPARTMENT_OTHER): Payer: BLUE CROSS/BLUE SHIELD

## 2016-07-13 ENCOUNTER — Encounter (HOSPITAL_BASED_OUTPATIENT_CLINIC_OR_DEPARTMENT_OTHER): Payer: Self-pay | Admitting: *Deleted

## 2016-07-13 ENCOUNTER — Emergency Department (HOSPITAL_BASED_OUTPATIENT_CLINIC_OR_DEPARTMENT_OTHER)
Admission: EM | Admit: 2016-07-13 | Discharge: 2016-07-13 | Disposition: A | Discharge status: Home / Self Care | Payer: BLUE CROSS/BLUE SHIELD | Attending: Emergency Medicine | Admitting: Emergency Medicine

## 2016-07-13 DIAGNOSIS — R3915 Urgency of urination: Secondary | ICD-10-CM | POA: Insufficient documentation

## 2016-07-13 DIAGNOSIS — Z79899 Other long term (current) drug therapy: Secondary | ICD-10-CM | POA: Insufficient documentation

## 2016-07-13 DIAGNOSIS — K219 Gastro-esophageal reflux disease without esophagitis: Secondary | ICD-10-CM | POA: Diagnosis not present

## 2016-07-13 DIAGNOSIS — J45909 Unspecified asthma, uncomplicated: Secondary | ICD-10-CM | POA: Diagnosis not present

## 2016-07-13 DIAGNOSIS — Z79891 Long term (current) use of opiate analgesic: Secondary | ICD-10-CM | POA: Diagnosis not present

## 2016-07-13 DIAGNOSIS — M7989 Other specified soft tissue disorders: Secondary | ICD-10-CM | POA: Insufficient documentation

## 2016-07-13 DIAGNOSIS — Z7951 Long term (current) use of inhaled steroids: Secondary | ICD-10-CM | POA: Diagnosis not present

## 2016-07-13 DIAGNOSIS — F17210 Nicotine dependence, cigarettes, uncomplicated: Secondary | ICD-10-CM | POA: Diagnosis not present

## 2016-07-13 DIAGNOSIS — R0602 Shortness of breath: Secondary | ICD-10-CM | POA: Diagnosis present

## 2016-07-13 HISTORY — DX: Pure hypercholesterolemia, unspecified: E78.00

## 2016-07-13 LAB — COMPREHENSIVE METABOLIC PANEL
ALT: 15 U/L (ref 14–54)
ANION GAP: 7 (ref 5–15)
AST: 20 U/L (ref 15–41)
Albumin: 3.8 g/dL (ref 3.5–5.0)
Alkaline Phosphatase: 87 U/L (ref 38–126)
BUN: 11 mg/dL (ref 6–20)
CALCIUM: 8.7 mg/dL — AB (ref 8.9–10.3)
CHLORIDE: 104 mmol/L (ref 101–111)
CO2: 27 mmol/L (ref 22–32)
Creatinine, Ser: 0.77 mg/dL (ref 0.44–1.00)
Glucose, Bld: 83 mg/dL (ref 65–99)
Potassium: 3.8 mmol/L (ref 3.5–5.1)
SODIUM: 138 mmol/L (ref 135–145)
Total Bilirubin: 0.5 mg/dL (ref 0.3–1.2)
Total Protein: 7.4 g/dL (ref 6.5–8.1)

## 2016-07-13 LAB — URINE MICROSCOPIC-ADD ON: RBC / HPF: NONE SEEN RBC/hpf (ref 0–5)

## 2016-07-13 LAB — URINALYSIS, ROUTINE W REFLEX MICROSCOPIC
Bilirubin Urine: NEGATIVE
GLUCOSE, UA: NEGATIVE mg/dL
KETONES UR: NEGATIVE mg/dL
LEUKOCYTES UA: NEGATIVE
Nitrite: NEGATIVE
PROTEIN: NEGATIVE mg/dL
Specific Gravity, Urine: 1.016 (ref 1.005–1.030)
pH: 6.5 (ref 5.0–8.0)

## 2016-07-13 LAB — PREGNANCY, URINE: PREG TEST UR: NEGATIVE

## 2016-07-13 LAB — D-DIMER, QUANTITATIVE (NOT AT ARMC): D DIMER QUANT: 0.4 ug{FEU}/mL (ref 0.00–0.50)

## 2016-07-13 LAB — TROPONIN I

## 2016-07-13 MED ORDER — GI COCKTAIL ~~LOC~~
30.0000 mL | Freq: Once | ORAL | Status: AC
  Administered 2016-07-13: 30 mL via ORAL
  Filled 2016-07-13: qty 30

## 2016-07-13 MED ORDER — ALBUTEROL SULFATE (2.5 MG/3ML) 0.083% IN NEBU
2.5000 mg | INHALATION_SOLUTION | Freq: Once | RESPIRATORY_TRACT | Status: AC
  Administered 2016-07-13: 2.5 mg via RESPIRATORY_TRACT
  Filled 2016-07-13: qty 3

## 2016-07-13 NOTE — ED Provider Notes (Signed)
[facility] Provider Note   CSN: [id number] Arrival date & time: 07/13/16  1642  First Provider Contact:  None     History   Chief Complaint Chief Complaint  Patient presents with  . Shortness of Breath    HPI Rita Dickson is a 30 y.o. female.  Patient is a 30 yo F with a pmhx of HLD, GERD, Asthma, and anxiety who presents today with complaints of SOB, chest pain, and bilateral ankle swelling. She said she began to notice to SOB yesterday while walking around at work and that it came on gradually. She also began to notice some sharp, L sided chest pain worse with exertion. Chest pain is non radiating and constant. Today, one of her co workers pointed out to her that her ankles were swollen. She says was scheduled to see an orthopedic surgeon for bilateral knee swelling and called their office to try and get an appointment sooner. She was told to go to the ED for evaluation. She denies recent surgery, hospitalization, and prolonged periods of immobilization. She does have a family history of clots in her her aunt and grandfather and she is currently taking birth control pills. She is sexually active and last menstrual period was one week ago. She is taking pravastatin, sumatriptan, Nexium, Singulair, birth control pills, and metronidazole - recently prescribed for bacterial vaginosis. She drinks occasional alcohol and denies illicit drug use. On ROS she endorses a chronic cough associated with her allergic rhinitis. She denies fevers at home and wheezing. No abdominal pain or changes in her BM. She denies dysuria but endorses urgency.       Past Medical History:  Diagnosis Date  . Anxiety   . Asthma   . Eczema   . GERD (gastroesophageal reflux disease)   . Hiatal hernia   . High cholesterol   . Migraines     There are no active problems to display for this patient.   Past Surgical History:  Procedure Laterality Date  . CHOLECYSTECTOMY    . TONSILLECTOMY       OB History    No data available       Home Medications    Prior to Admission medications   Medication Sig Start Date End Date Taking? Authorizing Provider  ALBUTEROL SULFATE HFA IN Inhale into the lungs.   Yes Historical Provider, MD  docusate sodium (COLACE) 100 MG capsule Take 1 capsule (100 mg total) by mouth every 12 (twelve) hours. 08/09/15  Yes Heather Laisure, PA-C  Esomeprazole Magnesium (NEXIUM PO) Take by mouth.   Yes Historical Provider, MD  Fexofenadine HCl (ALLEGRA PO) Take by mouth.   Yes Historical Provider, MD  LORazepam (ATIVAN) 1 MG tablet Take 1 mg by mouth 2 (two) times daily.   Yes Historical Provider, MD  naproxen (NAPROSYN) 500 MG tablet Take 1 tablet (500 mg total) by mouth 2 (two) times daily as needed (for bodyaches or fever). 02/26/16  Yes John Molpus, MD  norgestrel-ethinyl estradiol (LO/OVRAL,CRYSELLE) 0.3-30 MG-MCG tablet Take 1 tablet by mouth daily.   Yes Historical Provider, MD  polyethylene glycol powder (GLYCOLAX/MIRALAX) powder Take 17 g by mouth daily. 08/09/15  Yes Heather Laisure, PA-C  PRAVASTATIN SODIUM PO Take by mouth.   Yes Historical Provider, MD  SUMATRIPTAN SUCCINATE PO Take by mouth.   Yes Historical Provider, MD    Family History No family history on file.  Social History Social History  Substance Use Topics  . Smoking status: Current Some Day Smoker  Types: Cigarettes  . Smokeless tobacco: Never Used  . Alcohol use Yes     Comment: occ     Allergies   Review of patient's allergies indicates no known allergies.   Review of Systems Review of Systems  Constitutional: Negative.  Negative for fever.  HENT: Negative.   Eyes: Negative.   Respiratory: Positive for shortness of breath. Negative for cough and wheezing.   Cardiovascular: Positive for chest pain and leg swelling. Negative for palpitations.  Gastrointestinal: Negative.   Endocrine: Negative.   Genitourinary: Positive for urgency. Negative for dysuria, flank pain  and frequency.  Musculoskeletal: Negative.   Skin: Negative.   Allergic/Immunologic: Negative.   Neurological: Negative.   Hematological: Negative.   Psychiatric/Behavioral: Negative.      Physical Exam Updated Vital Signs BP 119/85 (BP Location: Right Arm)   Pulse 77   Temp 98.2 F (36.8 C) (Oral)   Resp 18   Ht  (1.753 m)   Wt (!) 139.7 kg   LMP 07/06/2016   SpO2 100%   BMI 45.48 kg/m   Physical Exam  Constitutional: She is oriented to person, place, and time. She appears well-developed and well-nourished. No distress.  HENT:  Head: Normocephalic and atraumatic.  Eyes: Conjunctivae are normal.  Neck: Neck supple.  Cardiovascular: Normal rate, regular rhythm, normal heart sounds and intact distal pulses.  Exam reveals no gallop and no friction rub.   No murmur heard. Pulmonary/Chest: Effort normal and breath sounds normal. No respiratory distress. She has no wheezes. She has no rales.  Abdominal: Soft. Bowel sounds are normal. She exhibits no distension. There is no tenderness.  Musculoskeletal: Normal range of motion. She exhibits no edema.  Neurological: She is alert and oriented to person, place, and time.  Skin: Skin is warm and dry.  Psychiatric: She has a normal mood and affect.  Nursing note and vitals reviewed.    ED Treatments / Results  Labs (all labs ordered are listed, but only abnormal results are displayed) Labs Reviewed  COMPREHENSIVE METABOLIC PANEL - Abnormal; Notable for the following:       Result Value   Calcium 8.7 (*)    All other components within normal limits  URINALYSIS, ROUTINE W REFLEX MICROSCOPIC (NOT AT [facility]) - Abnormal; Notable for the following:    APPearance CLOUDY (*)    Hgb urine dipstick TRACE (*)    All other components within normal limits  URINE MICROSCOPIC-ADD ON - Abnormal; Notable for the following:    Squamous Epithelial / LPF 6-30 (*)    Bacteria, UA MANY (*)    All other components within normal limits   PREGNANCY, URINE  TROPONIN I  D-DIMER, QUANTITATIVE (NOT AT [facility])    EKG  EKG Interpretation  Date/Time:  Thursday July 13 2016 17:53:55 EDT Ventricular Rate:  71 PR Interval:    QRS Duration: 92 QT Interval:  370 QTC Calculation: 402 R Axis:   62 Text Interpretation:  Sinus rhythm Normal ECG Confirmed by BEATON  MD, ROBERT ([id number]) on 07/13/2016 5:58:42 PM       Radiology Dg Chest Port 1 View  Result Date: 07/13/2016 CLINICAL DATA:  Pt co sob, chest pain since yesterday, swelling in lower ext EXAM: PORTABLE CHEST 1 VIEW COMPARISON:  11/27/2015 FINDINGS: The heart size and mediastinal contours are within normal limits. Both lungs are clear. No pleural effusion or pneumothorax. The visualized skeletal structures are unremarkable. IMPRESSION: Normal frontal chest radiograph. Electronically Signed   By: Amie Portland  M.D.   On: 07/13/2016 17:54    Procedures Procedures (including critical care time)  Medications Ordered in ED Medications  gi cocktail (Maalox,Lidocaine,Donnatal) (30 mLs Oral Given 07/13/16 1752)  albuterol (PROVENTIL) (2.5 MG/3ML) 0.083% nebulizer solution 2.5 mg (2.5 mg Nebulization Given 07/13/16 1806)     Initial Impression / Assessment and Plan / ED Course  I have reviewed the triage vital signs and the nursing notes.  Pertinent labs & imaging results that were available during my care of the patient were reviewed by me and considered in my medical decision making (see chart for details).  Clinical Course   Chest pain/SOB: EKG normal sinus rhythm with no ischemic changes. Troponin <0.03 and D-Dimer normal. CXR with no acute cardiopulmonary disease.  Heart score of 1. Given GI cocktail and albuterol nebulizer with complete resolution of symptoms. Chest pain is likely due to acute on chronic GERD and SOB exacerbation of her asthma. Instructed patient to take her Nexium daily instead of prn and home albuterol inhaler prn for her SOB. Encouraged patient to  follow up with PCP.  Ankle/Knee Swelling: Etiology is unclear. Patient denies pain. No pitting edema on exam - possible joint effusions but difficult to assess. She was previously referred to see an orthopedic doctor but continued to miss her scheduled appointments. Encouraged patient to talk with her PCP for further work up and a re referral.    Final Clinical Impressions(s) / ED Diagnoses   Final diagnoses:  None    New Prescriptions New Prescriptions   No medications on file     Reymundo Poll, MD 07/13/16 1610    Nelva Nay, MD 07/13/16 1904

## 2016-07-13 NOTE — Discharge Instructions (Signed)
Please continue to take your Nexium daily for reflux and use your albuterol inhaler as needed for asthma. Please call your primary care doctor to schedule a follow up appointment as soon as possible. Please discuss your knee and ankle swelling with your primary care doctor and ask about re referral to an orthopedic surgeon.

## 2016-07-13 NOTE — ED Triage Notes (Signed)
Sob and swelling in her ankles x 2 days. She is ambulatory, speaking in complete sentences and lungs are clear on arrival to triage.

## 2017-01-13 ENCOUNTER — Encounter (HOSPITAL_BASED_OUTPATIENT_CLINIC_OR_DEPARTMENT_OTHER): Payer: Self-pay | Admitting: *Deleted

## 2017-01-13 DIAGNOSIS — Z79899 Other long term (current) drug therapy: Secondary | ICD-10-CM | POA: Insufficient documentation

## 2017-01-13 DIAGNOSIS — Z7289 Other problems related to lifestyle: Secondary | ICD-10-CM | POA: Insufficient documentation

## 2017-01-13 DIAGNOSIS — J181 Lobar pneumonia, unspecified organism: Secondary | ICD-10-CM | POA: Insufficient documentation

## 2017-01-13 DIAGNOSIS — F17210 Nicotine dependence, cigarettes, uncomplicated: Secondary | ICD-10-CM | POA: Insufficient documentation

## 2017-01-13 DIAGNOSIS — J45909 Unspecified asthma, uncomplicated: Secondary | ICD-10-CM | POA: Insufficient documentation

## 2017-01-13 NOTE — ED Triage Notes (Signed)
Pt given a rx for tamiflu last week, reports that she just got it filled today

## 2017-01-13 NOTE — ED Triage Notes (Signed)
Cough with generalized body aches x several weeks. Upper back pain and Chest pressure with coughing.  Pt also reports being tired.

## 2017-01-14 ENCOUNTER — Emergency Department (HOSPITAL_BASED_OUTPATIENT_CLINIC_OR_DEPARTMENT_OTHER): Payer: Self-pay

## 2017-01-14 ENCOUNTER — Emergency Department (HOSPITAL_BASED_OUTPATIENT_CLINIC_OR_DEPARTMENT_OTHER)
Admission: EM | Admit: 2017-01-14 | Discharge: 2017-01-14 | Disposition: A | Discharge status: Home / Self Care | Payer: Self-pay | Attending: Emergency Medicine | Admitting: Emergency Medicine

## 2017-01-14 DIAGNOSIS — R52 Pain, unspecified: Secondary | ICD-10-CM

## 2017-01-14 DIAGNOSIS — J189 Pneumonia, unspecified organism: Secondary | ICD-10-CM

## 2017-01-14 DIAGNOSIS — J181 Lobar pneumonia, unspecified organism: Secondary | ICD-10-CM

## 2017-01-14 DIAGNOSIS — Z765 Malingerer [conscious simulation]: Secondary | ICD-10-CM

## 2017-01-14 MED ORDER — HYDROCODONE-ACETAMINOPHEN 5-325 MG PO TABS: 1.0000 | ORAL_TABLET | ORAL | 0 refills | Status: DC | PRN

## 2017-01-14 MED ORDER — DOXYCYCLINE HYCLATE 100 MG PO CAPS: 100.0000 mg | ORAL_CAPSULE | Freq: Two times a day (BID) | ORAL | 0 refills | Status: DC

## 2017-01-14 NOTE — ED Provider Notes (Addendum)
[facility] Provider Note: Lowella DellJ. Lane Shannen Vernon, MD, FACEP  CSN: [id number] MRN: [id number] ARRIVAL: 01/13/17 at 2252 ROOM: MHOTF/OTF   CHIEF COMPLAINT  Generalized Body Aches   HISTORY OF PRESENT ILLNESS  Rita Dickson is a 31 y.o. female with a three-week history of generalized body aches. The onset has been gradual in her symptoms are moderate to severe. Symptoms are worst in her chest and back and exacerbated by coughing. She was seen by her PCP on January 24 and was given a prescription for Tamiflu. She did not get the prescription filled until yesterday. She has not taken any Tamiflu yet. She has had a persistent nonproductive cough over the same time period. She denies fever, chills, shortness of breath different than her usual asthma, nausea, vomiting, diarrhea or urinary changes. She has been taking ibuprofen without relief.   Past Medical History:  Diagnosis Date  . Anxiety   . Asthma   . Eczema   . GERD (gastroesophageal reflux disease)   . Hiatal hernia   . High cholesterol   . Migraines     Past Surgical History:  Procedure Laterality Date  . CHOLECYSTECTOMY    . TONSILLECTOMY      History reviewed. No pertinent family history.  Social History  Substance Use Topics  . Smoking status: Current Some Day Smoker    Types: Cigarettes  . Smokeless tobacco: Never Used  . Alcohol use Yes     Comment: occ    Prior to Admission medications   Medication Sig Start Date End Date Taking? Authorizing Provider  ALBUTEROL SULFATE HFA IN Inhale into the lungs.    Historical Provider, MD  docusate sodium (COLACE) 100 MG capsule Take 1 capsule (100 mg total) by mouth every 12 (twelve) hours. 08/09/15   Heather Laisure, PA-C  doxycycline (VIBRAMYCIN) 100 MG capsule Take 1 capsule (100 mg total) by mouth 2 (two) times daily. One po bid x 7 days 01/14/17   Paula LibraJohn Guliana Weyandt, MD  Esomeprazole Magnesium (NEXIUM PO) Take by mouth.    Historical Provider, MD  Fexofenadine HCl  (ALLEGRA PO) Take by mouth.    Historical Provider, MD  HYDROcodone-acetaminophen (NORCO) 5-325 MG tablet Take 1 tablet by mouth every 4 (four) hours as needed (for pain). 01/14/17   Dayven Linsley, MD  LORazepam (ATIVAN) 1 MG tablet Take 1 mg by mouth 2 (two) times daily.    Historical Provider, MD  norgestrel-ethinyl estradiol (LO/OVRAL,CRYSELLE) 0.3-30 MG-MCG tablet Take 1 tablet by mouth daily.    Historical Provider, MD  polyethylene glycol powder (GLYCOLAX/MIRALAX) powder Take 17 g by mouth daily. 08/09/15   Heather Laisure, PA-C  PRAVASTATIN SODIUM PO Take by mouth.    Historical Provider, MD  SUMATRIPTAN SUCCINATE PO Take by mouth.    Historical Provider, MD    Allergies Patient has no known allergies.   REVIEW OF SYSTEMS  Negative except as noted here or in the History of Present Illness.   PHYSICAL EXAMINATION  Initial Vital Signs Blood pressure 116/98, pulse 98, temperature 98.5 F (36.9 C), temperature source Oral, resp. rate 22, height 5\' 9"  (1.753 m), weight (!) 324 lb (147 kg), last menstrual period 12/30/2016, SpO2 99 %.  Examination General: Well-developed, obese female in no acute distress; appearance consistent with age of record HENT: normocephalic; atraumatic Eyes: pupils equal, round and reactive to light; extraocular muscles intact Neck: supple Heart: regular rate and rhythm Lungs: clear to auscultation bilaterally Abdomen: soft; obese; nontender; bowel sounds present Extremities: No deformity; full  range of motion; pulses normal Neurologic: Awake, alert and oriented; motor function intact in all extremities and symmetric; no facial droop Skin: Warm and dry Psychiatric: Normal mood and affect   RESULTS  Summary of this visit's results, reviewed by myself:   EKG Interpretation  Date/Time:    Ventricular Rate:    PR Interval:    QRS Duration:   QT Interval:    QTC Calculation:   R Axis:     Text Interpretation:        Laboratory Studies: No results  found for this or any previous visit (from the past 24 hour(s)). Imaging Studies: Dg Chest 2 View  Result Date: 01/14/2017 CLINICAL DATA:  Cough with generalized body aches EXAM: CHEST  2 VIEW COMPARISON:  07/13/2006 FINDINGS: A focal opacity is present in the right lower lobe and is suspicious for pneumonia. No pleural effusion. Normal heart size. No pneumothorax. IMPRESSION: Findings suspicious for a right lower lobe pneumonia. Electronically Signed   By: Jasmine Pang M.D.   On: 01/14/2017 01:55    ED COURSE  Nursing notes and initial vitals signs, including pulse oximetry, reviewed.  Vitals:   01/13/17 2301 01/13/17 2302 01/14/17 0220  BP: 116/98  128/79  Pulse: 98  88  Resp: 22  16  Temp: 98.5 F (36.9 C)  98.2 F (36.8 C)  TempSrc: Oral  Oral  SpO2: 99%  99%  Weight:  (!) 324 lb (147 kg)   Height:  5\' 9"  (1.753 m)    2:07 AM We will treat for pneumonia as seen on chest x-ray. The patient's persistent body aches are concerning for a more chronic condition, such as rheumatologic condition. She was advised follow-up with her PCP as soon as possible we cannot workup such conditions in the ED. She has been taking ibuprofen 800 milligrams without relief of the pain we will prescribe a short course of the narcotic until she can follow-up.  The patient was given an Rx for hydrocodone/APAP at discharge. She became irate, demanding "something stronger" because she "had had breast reduction surgery and 'knew that' hydrocodone wasn't 'strong enough'." No Rx change was made.  PROCEDURES    ED DIAGNOSES     ICD-9-CM ICD-10-CM   1. Community acquired pneumonia of right lower lobe of lung (HCC) 481 J18.1   2. Generalized pain 780.96 R52   3. Drug-seeking behavior 305.90 Z76.5        Paula Libra, MD 01/14/17 1610    Paula Libra, MD 01/14/17 1133

## 2017-01-14 NOTE — ED Notes (Signed)
[facility] called to request info regarding the Vicodin prescription for Rita Dickson.  States Rita Dickson showed up at the pharmacy with the pain medication prescription only, and the paperwork was torn in half.  Pharmacist stated that they normally will not fill only one prescription without the other.  Discussed the two prescriptions and was told that the patient picked up a prescription for Doxycycline yesterday that was written by a Dr. Allena KatzPatel on 01/05/17, so she would go ahead and fill the vicodin.

## 2017-01-14 NOTE — ED Notes (Signed)
Patient called and requested to have a stronger prescription for Vicodan than the one that was given to her last night.  States she needs to have Vicodan 325/10mg  due to her weight.  Explained that the doctors who are here now will not change the prescription since they have not seen her and the doctor who wrote the prescription will be here tonight at 11 pm, but may not change the prescription without seeing her.

## 2017-01-20 ENCOUNTER — Encounter (HOSPITAL_BASED_OUTPATIENT_CLINIC_OR_DEPARTMENT_OTHER): Payer: Self-pay | Admitting: *Deleted

## 2017-01-20 ENCOUNTER — Emergency Department (HOSPITAL_BASED_OUTPATIENT_CLINIC_OR_DEPARTMENT_OTHER): Payer: Self-pay

## 2017-01-20 ENCOUNTER — Emergency Department (HOSPITAL_BASED_OUTPATIENT_CLINIC_OR_DEPARTMENT_OTHER)
Admission: EM | Admit: 2017-01-20 | Discharge: 2017-01-20 | Disposition: A | Discharge status: Home / Self Care | Payer: Self-pay | Attending: Physician Assistant | Admitting: Physician Assistant

## 2017-01-20 DIAGNOSIS — Z09 Encounter for follow-up examination after completed treatment for conditions other than malignant neoplasm: Secondary | ICD-10-CM

## 2017-01-20 DIAGNOSIS — F17210 Nicotine dependence, cigarettes, uncomplicated: Secondary | ICD-10-CM | POA: Insufficient documentation

## 2017-01-20 DIAGNOSIS — R05 Cough: Secondary | ICD-10-CM | POA: Insufficient documentation

## 2017-01-20 DIAGNOSIS — J45909 Unspecified asthma, uncomplicated: Secondary | ICD-10-CM | POA: Insufficient documentation

## 2017-01-20 DIAGNOSIS — R059 Cough, unspecified: Secondary | ICD-10-CM

## 2017-01-20 NOTE — ED Triage Notes (Signed)
States that she is f/u to make sure that the PNA that she was dx with last week is gone.  Finishes antibiotics today

## 2017-01-20 NOTE — ED Provider Notes (Signed)
[facility]   CSN: [id number] Arrival date & time: 01/20/17  1727     History   Chief Complaint Chief Complaint  Patient presents with  . Follow-up    HPI Rita Dickson is a 31 y.o. female.  The history is provided by the patient and medical records.     31 year old female with history of anxiety, asthma, eczema, GERD, migraines, presenting to the ED for follow-up. Patient reports she was diagnosed with pneumonia about a week ago and completed course of doxycycline. States overall she feels improved, continues to have an occasional dry cough. She denies any fever or chills. No nausea or vomiting. Has been able to eat and drink normally. Her body aches have improved. States she was told she needed a follow-up chest x-ray so she came back here today.  Past Medical History:  Diagnosis Date  . Anxiety   . Asthma   . Eczema   . GERD (gastroesophageal reflux disease)   . Hiatal hernia   . High cholesterol   . Migraines     There are no active problems to display for this patient.   Past Surgical History:  Procedure Laterality Date  . CHOLECYSTECTOMY    . TONSILLECTOMY      OB History    No data available       Home Medications    Prior to Admission medications   Medication Sig Start Date End Date Taking? Authorizing Provider  ALBUTEROL SULFATE HFA IN Inhale into the lungs.    Historical Provider, MD  docusate sodium (COLACE) 100 MG capsule Take 1 capsule (100 mg total) by mouth every 12 (twelve) hours. 08/09/15   Heather Laisure, PA-C  doxycycline (VIBRAMYCIN) 100 MG capsule Take 1 capsule (100 mg total) by mouth 2 (two) times daily. One po bid x 7 days 01/14/17   Paula LibraJohn Molpus, MD  Esomeprazole Magnesium (NEXIUM PO) Take by mouth.    Historical Provider, MD  Fexofenadine HCl (ALLEGRA PO) Take by mouth.    Historical Provider, MD  HYDROcodone-acetaminophen (NORCO) 5-325 MG tablet Take 1 tablet by mouth every 4 (four) hours as needed  (for pain). 01/14/17   John Molpus, MD  LORazepam (ATIVAN) 1 MG tablet Take 1 mg by mouth 2 (two) times daily.    Historical Provider, MD  norgestrel-ethinyl estradiol (LO/OVRAL,CRYSELLE) 0.3-30 MG-MCG tablet Take 1 tablet by mouth daily.    Historical Provider, MD  polyethylene glycol powder (GLYCOLAX/MIRALAX) powder Take 17 g by mouth daily. 08/09/15   Heather Laisure, PA-C  PRAVASTATIN SODIUM PO Take by mouth.    Historical Provider, MD  SUMATRIPTAN SUCCINATE PO Take by mouth.    Historical Provider, MD    Family History History reviewed. No pertinent family history.  Social History Social History  Substance Use Topics  . Smoking status: Current Some Day Smoker    Types: Cigarettes  . Smokeless tobacco: Never Used  . Alcohol use Yes     Comment: occ     Allergies   Patient has no known allergies.   Review of Systems Review of Systems  Respiratory: Positive for cough.   All other systems reviewed and are negative.    Physical Exam Updated Vital Signs BP 124/88 (BP Location: Left Arm)   Pulse 103   Temp 98.2 F (36.8 C) (Oral)   Resp 18   Ht 5\' 9"  (1.753 m)   Wt (!) 147 kg   LMP 12/30/2016   SpO2 100%   BMI  47.85 kg/m   Physical Exam  Constitutional: She is oriented to person, place, and time. She appears well-developed and well-nourished.  HENT:  Head: Normocephalic and atraumatic.  Mouth/Throat: Oropharynx is clear and moist.  Eyes: Conjunctivae and EOM are normal. Pupils are equal, round, and reactive to light.  Neck: Normal range of motion.  Cardiovascular: Normal rate, regular rhythm and normal heart sounds.   Pulmonary/Chest: Effort normal and breath sounds normal. No respiratory distress. She has no wheezes.  Lungs clear, no distress, speaking in paragraphs without difficulty  Abdominal: Soft. Bowel sounds are normal.  Musculoskeletal: Normal range of motion.  Neurological: She is alert and oriented to person, place, and time.  Skin: Skin is warm and  dry.  Psychiatric: She has a normal mood and affect.  Nursing note and vitals reviewed.    ED Treatments / Results  Labs (all labs ordered are listed, but only abnormal results are displayed) Labs Reviewed - No data to display  EKG  EKG Interpretation None       Radiology Dg Chest 2 View  Result Date: 01/20/2017 CLINICAL DATA:  Diagnosed with pneumonia last weekend, residual cough and some shortness of breath following antibiotics, asthma, light smoker EXAM: CHEST  2 VIEW COMPARISON:  01/14/2017 FINDINGS: Normal heart size, mediastinal contours, and pulmonary vascularity. Lungs clear. No pleural effusion or pneumothorax. Bones unremarkable. IMPRESSION: Normal exam. Electronically Signed   By: Ulyses Southward M.D.   On: 01/20/2017 18:28    Procedures Procedures (including critical care time)  Medications Ordered in ED Medications - No data to display   Initial Impression / Assessment and Plan / ED Course  I have reviewed the triage vital signs and the nursing notes.  Pertinent labs & imaging results that were available during my care of the patient were reviewed by me and considered in my medical decision making (see chart for details).  31 year old female here with recheck of cough. Recent diagnosis of pneumonia, finished course of antibiotics today. She is afebrile and nontoxic. Overall appears well. Exam is benign. Repeat chest x-ray today is normal. No further treatment needed. Recommended to follow-up with her primary care doctor as needed.   Discussed plan with patient, he/she acknowledged understanding and agreed with plan of care.  Return precautions given for new or worsening symptoms.  Final Clinical Impressions(s) / ED Diagnoses   Final diagnoses:  Follow up  Cough    New Prescriptions New Prescriptions   No medications on file     Oletha Blend 01/20/17 1904    Courteney Randall An, MD 01/20/17 1924

## 2017-01-20 NOTE — Discharge Instructions (Signed)
Pneumonia has resolved at this time. No further treatment needed. Follow-up with your doctor as needed.

## 2017-03-25 IMAGING — CR DG CHEST 2V
2 series · 2 of 2 positions shown · non-contrast
Comparison: 01/14/2017

CLINICAL DATA: Diagnosed with pneumonia last weekend, residual
cough and some shortness of breath following antibiotics, asthma,
light smoker

EXAM:
CHEST  2 VIEW

[w chest pa]
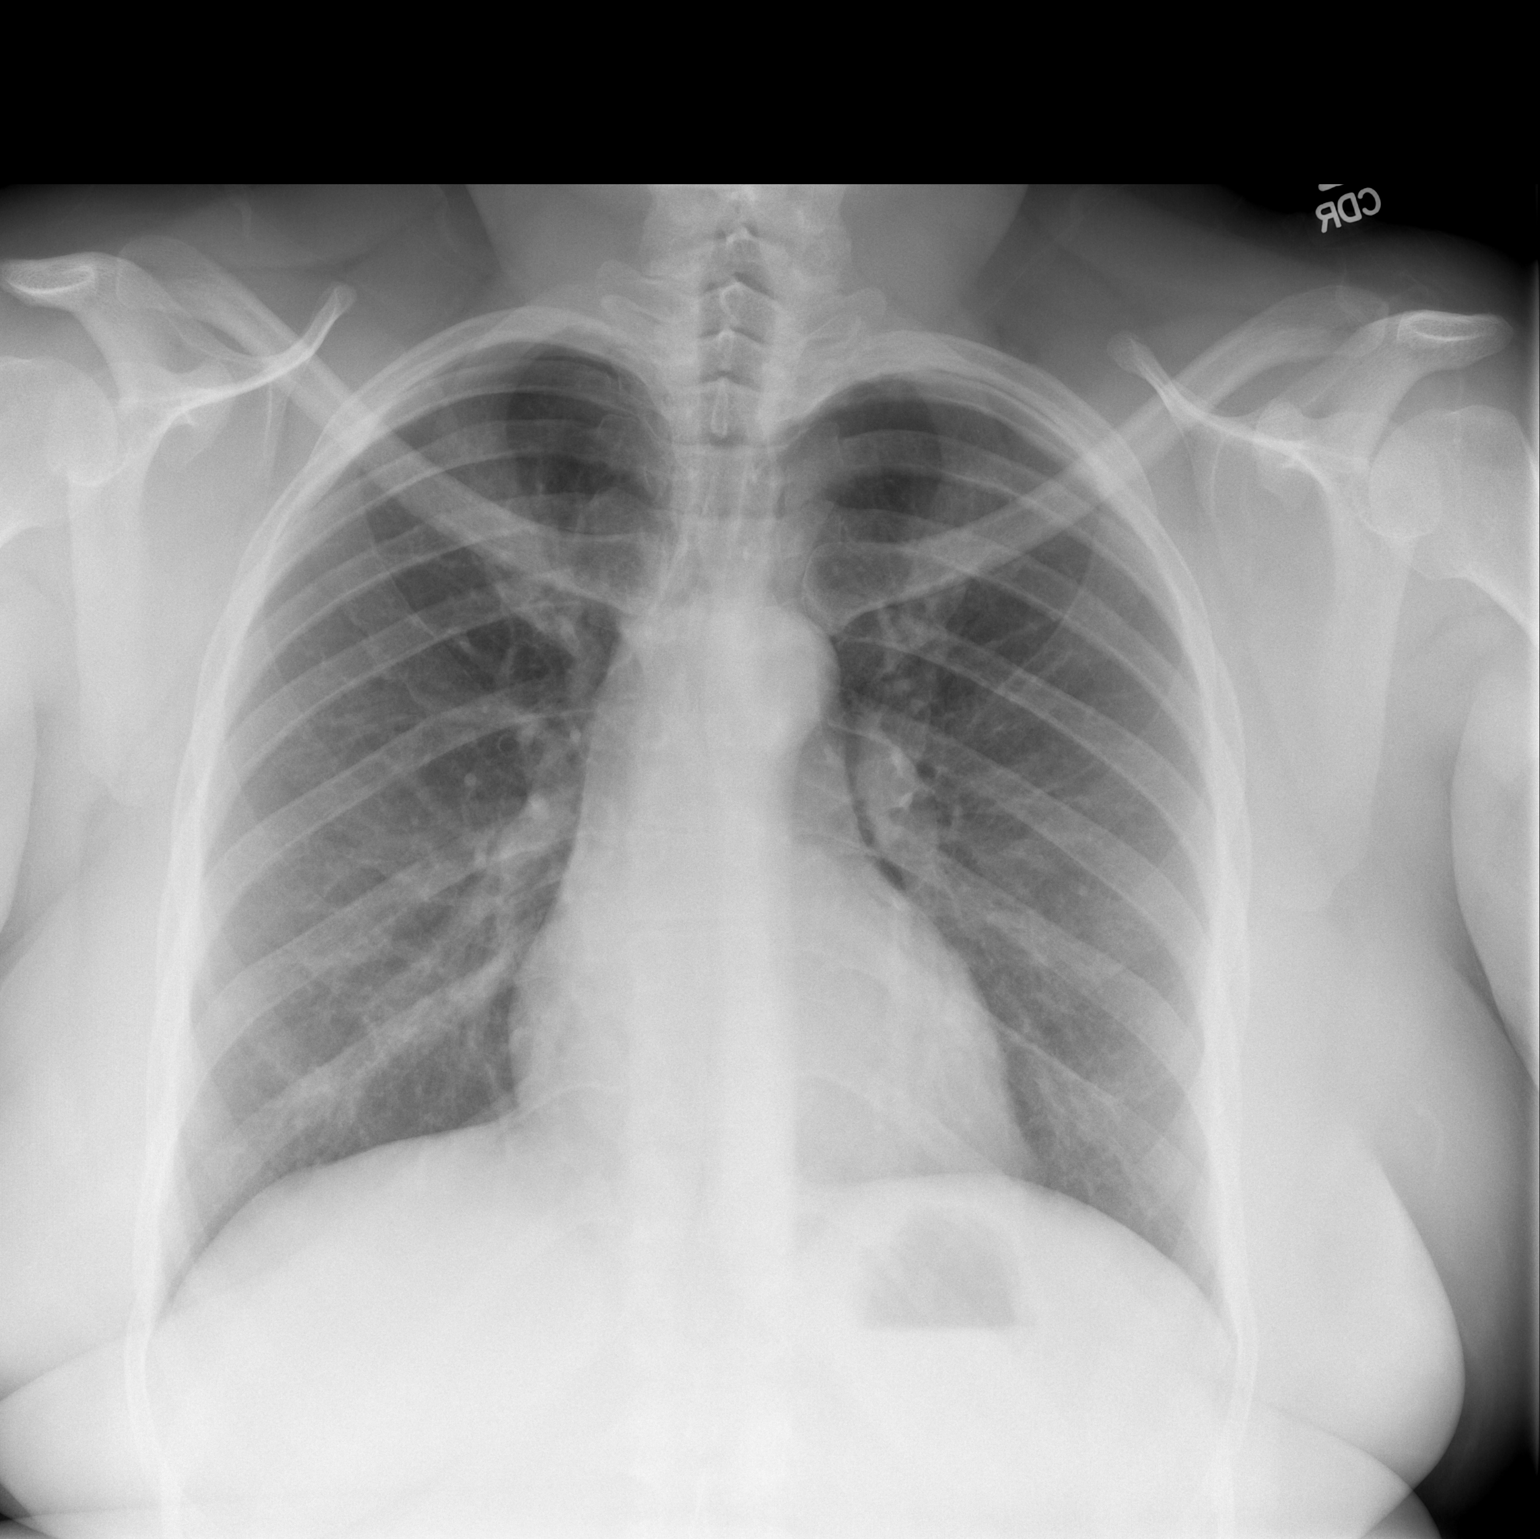

[w chest lat]
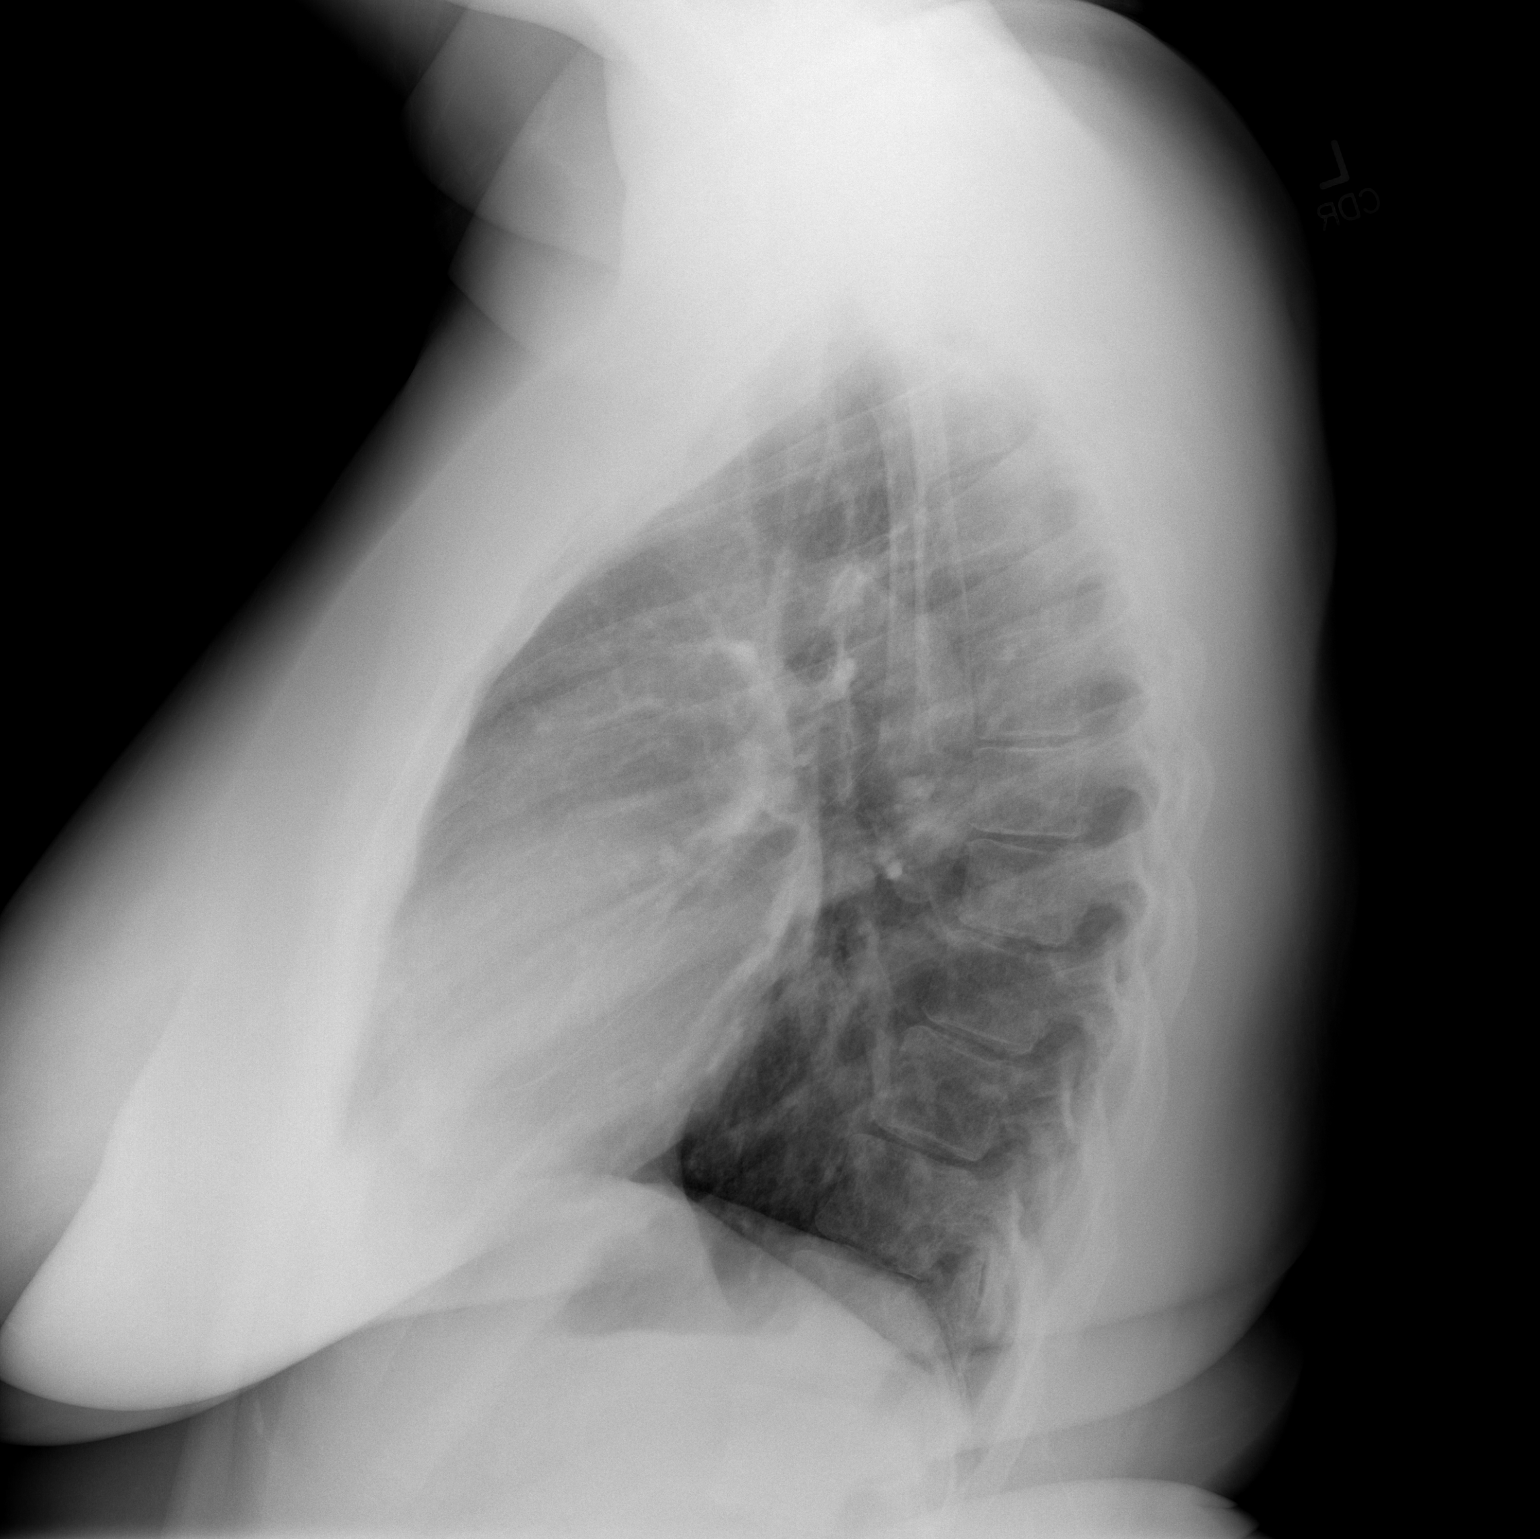

[2 of 2 positions shown; findings below may reference images not displayed]

FINDINGS: Normal heart size, mediastinal contours, and pulmonary vascularity.

Lungs clear.

No pleural effusion or pneumothorax.

Bones unremarkable.
IMPRESSION: Normal exam.

## 2017-05-01 ENCOUNTER — Emergency Department (HOSPITAL_BASED_OUTPATIENT_CLINIC_OR_DEPARTMENT_OTHER)
Admission: EM | Admit: 2017-05-01 | Discharge: 2017-05-01 | Disposition: A | Discharge status: Home / Self Care | Payer: BLUE CROSS/BLUE SHIELD | Attending: Emergency Medicine | Admitting: Emergency Medicine

## 2017-05-01 ENCOUNTER — Encounter (HOSPITAL_BASED_OUTPATIENT_CLINIC_OR_DEPARTMENT_OTHER): Payer: Self-pay | Admitting: *Deleted

## 2017-05-01 DIAGNOSIS — J069 Acute upper respiratory infection, unspecified: Secondary | ICD-10-CM | POA: Insufficient documentation

## 2017-05-01 DIAGNOSIS — B9789 Other viral agents as the cause of diseases classified elsewhere: Secondary | ICD-10-CM

## 2017-05-01 DIAGNOSIS — Z7902 Long term (current) use of antithrombotics/antiplatelets: Secondary | ICD-10-CM | POA: Insufficient documentation

## 2017-05-01 DIAGNOSIS — F17210 Nicotine dependence, cigarettes, uncomplicated: Secondary | ICD-10-CM | POA: Insufficient documentation

## 2017-05-01 DIAGNOSIS — J329 Chronic sinusitis, unspecified: Secondary | ICD-10-CM

## 2017-05-01 DIAGNOSIS — J019 Acute sinusitis, unspecified: Secondary | ICD-10-CM | POA: Insufficient documentation

## 2017-05-01 DIAGNOSIS — Z79899 Other long term (current) drug therapy: Secondary | ICD-10-CM | POA: Insufficient documentation

## 2017-05-01 DIAGNOSIS — J45909 Unspecified asthma, uncomplicated: Secondary | ICD-10-CM | POA: Insufficient documentation

## 2017-05-01 MED ORDER — AMOXICILLIN-POT CLAVULANATE 875-125 MG PO TABS: 1.0000 | ORAL_TABLET | Freq: Two times a day (BID) | ORAL | 0 refills | Status: DC

## 2017-05-01 MED ORDER — PSEUDOEPH-BROMPHEN-DM 30-2-10 MG/5ML PO SYRP: 5.0000 mL | ORAL_SOLUTION | Freq: Four times a day (QID) | ORAL | 0 refills | Status: DC | PRN

## 2017-05-01 MED ORDER — TRIAMCINOLONE ACETONIDE 55 MCG/ACT NA AERO: 2.0000 | INHALATION_SPRAY | Freq: Every day | NASAL | 0 refills | Status: DC

## 2017-05-01 NOTE — ED Provider Notes (Signed)
[facility] Provider [facility]   CSN: [id number] Arrival date & time: 05/01/17  1703  By signing my name below, I, Teofilo PodMatthew P. Jamison, attest that this documentation has been prepared under the direction and in the presence of Felicie Mornavid Christophor Eick, NP. Electronically Signed: Teofilo PodMatthew P. Jamison, ED Scribe. 05/01/2017. 5:43 PM.   History   Chief Complaint Chief Complaint  Patient presents with  . URI    The history is provided by the patient. No language interpreter was used.   HPI Comments:  Rita Dickson is a 31 y.o. female who presents to the Emergency Department complaining of constant sore throat x 5 days. Pt complains of associated congestion, bilateral ear pain, sinus pressure, postnasal drip, cough, headache. No alleviating factors noted. Denies urinary symptoms, and other associated symptoms.   Past Medical History:  Diagnosis Date  . Anxiety   . Asthma   . Eczema   . GERD (gastroesophageal reflux disease)   . Hiatal hernia   . High cholesterol   . Migraines     There are no active problems to display for this patient.   Past Surgical History:  Procedure Laterality Date  . CHOLECYSTECTOMY    . TONSILLECTOMY      OB History    No data available       Home Medications    Prior to Admission medications   Medication Sig Start Date End Date Taking? Authorizing Provider  ALBUTEROL SULFATE HFA IN Inhale into the lungs.    [provider]  docusate sodium (COLACE) 100 MG capsule Take 1 capsule (100 mg total) by mouth every 12 (twelve) hours. 08/09/15   Santiago GladLaisure, Heather, PA-C  Esomeprazole Magnesium (NEXIUM PO) Take by mouth.    [provider]  Fexofenadine HCl (ALLEGRA PO) Take by mouth.    [provider]  LORazepam (ATIVAN) 1 MG tablet Take 1 mg by mouth 2 (two) times daily.    [provider]  norgestrel-ethinyl estradiol (LO/OVRAL,CRYSELLE) 0.3-30 MG-MCG tablet Take 1 tablet by mouth daily.    [provider]    polyethylene glycol powder (GLYCOLAX/MIRALAX) powder Take 17 g by mouth daily. 08/09/15   Santiago GladLaisure, Heather, PA-C  PRAVASTATIN SODIUM PO Take by mouth.    [provider]  SUMATRIPTAN SUCCINATE PO Take by mouth.    [provider]    Family History History reviewed. No pertinent family history.  Social History Social History  Substance Use Topics  . Smoking status: Current Some Day Smoker    Types: Cigarettes  . Smokeless tobacco: Never Used  . Alcohol use Yes     Comment: occ     Allergies   Patient has no known allergies.   Review of Systems Review of Systems  Constitutional: Negative for fever.  HENT: Positive for congestion, ear pain, postnasal drip, sinus pressure and sore throat.   Respiratory: Positive for cough.   Genitourinary: Negative for dysuria.  Neurological: Positive for headaches.  All other systems reviewed and are negative.    Physical Exam Updated Vital Signs BP (!) 140/92   Pulse 87   Temp 98.8 F (37.1 C) (Oral)   Resp 16   Ht 5\' 9"  (1.753 m)   Wt (!) 340 lb (154.2 kg)   LMP 04/10/2017   SpO2 98%   BMI 50.21 kg/m   Physical Exam  Constitutional: She appears well-developed and well-nourished. No distress.  HENT:  Head: Normocephalic and atraumatic.  Right Ear: External ear normal.  Left Ear: External ear  normal.  Nasal mucosal edema.  Eyes: Conjunctivae are normal.  Cardiovascular: Normal rate.   Pulmonary/Chest: Effort normal and breath sounds normal.  Abdominal: She exhibits no distension.  Neurological: She is alert.  Skin: Skin is warm and dry.  Psychiatric: She has a normal mood and affect.  Nursing note and vitals reviewed.    ED Treatments / Results  DIAGNOSTIC STUDIES:  Oxygen Saturation is 98% on RA, normal by my interpretation.    COORDINATION OF CARE:  5:36 PM Discussed treatment plan with pt at bedside and pt agreed to plan.   Labs (all labs ordered are listed, but only abnormal results are  displayed) Labs Reviewed - No data to display  EKG  EKG Interpretation None       Radiology No results found.  Procedures Procedures (including critical care time)  Medications Ordered in ED Medications - No data to display   Initial Impression / Assessment and Plan / ED Course  I have reviewed the triage vital signs and the nursing notes.  Pertinent labs & imaging results that were available during my care of the patient were reviewed by me and considered in my medical decision making (see chart for details).     Pt symptoms consistent with URI.  Pt will be discharged with symptomatic treatment.  Discussed return precautions and antibiotic resistance.  Patient provided with rx for augmentin (fill date after 05/06/17) in the event sinus congestion/pain/pressure does not respond to conservative treatment.  Pt is hemodynamically stable & in NAD prior to discharge.  Final Clinical Impressions(s) / ED Diagnoses   Final diagnoses:  Viral URI with cough  Viral sinusitis    New Prescriptions Discharge Medication List as of 05/01/2017  6:10 PM    START taking these medications   Details  brompheniramine-pseudoephedrine-DM 30-2-10 MG/5ML syrup Take 5 mLs by mouth 4 (four) times daily as needed., Starting Tue 05/01/2017, Print    triamcinolone (NASACORT) 55 MCG/ACT AERO nasal inhaler Place 2 sprays into the nose daily., Starting Tue 05/01/2017, Print        I personally performed the services described in this documentation, which was scribed in my presence. The recorded information has been reviewed and is accurate.    Felicie Morn, NP 05/01/17 1610    Azalia Bilis, MD 05/01/17 [phone]

## 2017-05-01 NOTE — Discharge Instructions (Signed)
Sinusitis is predominantly viral in origin.  You have been provided a prescription for an antibiotic should your symptoms continue beyond June 3 without improvement.

## 2017-05-01 NOTE — ED Triage Notes (Signed)
Pt c/o URI symptoms x 5 days also c/o bil ear pain  And facial pain

## 2017-05-01 NOTE — ED Notes (Signed)
ED Provider at bedside. 

## 2024-05-02 ENCOUNTER — Encounter (HOSPITAL_COMMUNITY): Payer: Self-pay | Admitting: *Deleted

## 2024-05-02 ENCOUNTER — Ambulatory Visit (HOSPITAL_COMMUNITY): Admission: EM | Admit: 2024-05-02 | Discharge: 2024-05-02 | Disposition: A | Discharge status: Home / Self Care

## 2024-05-02 DIAGNOSIS — J0140 Acute pansinusitis, unspecified: Secondary | ICD-10-CM

## 2024-05-02 DIAGNOSIS — T3695XA Adverse effect of unspecified systemic antibiotic, initial encounter: Secondary | ICD-10-CM | POA: Diagnosis not present

## 2024-05-02 DIAGNOSIS — B379 Candidiasis, unspecified: Secondary | ICD-10-CM

## 2024-05-02 MED ORDER — FLUCONAZOLE 150 MG PO TABS: 150.0000 mg | ORAL_TABLET | Freq: Once | ORAL | 0 refills | Status: AC

## 2024-05-02 MED ORDER — AMOXICILLIN-POT CLAVULANATE 875-125 MG PO TABS
1.0000 | ORAL_TABLET | Freq: Two times a day (BID) | ORAL | 0 refills | Status: DC
Start: 2024-05-02 — End: 2024-06-19

## 2024-05-02 NOTE — ED Triage Notes (Signed)
 Pt states she has sinus pressure and pain, congestion X 5 days. She has been using nasal spray, IBU, OTC allergy meds no relief.

## 2024-05-02 NOTE — ED Provider Notes (Signed)
 [facility]    CSN: [id number] Arrival date & time: 05/02/24  1922      History   Chief Complaint Chief Complaint  Patient presents with   Nasal Congestion   Facial Pain    HPI Rita Dickson is a 38 y.o. female.   Patient presents today with a 5-day history of worsening URI symptoms including sinus pressure, headache, cough, postnasal drip.  Denies any fever, chest pain, nausea, vomiting.  She has been taking ibuprofen  and over-the-counter allergy medicine without improvement of symptoms.  She does have severe allergies that often trigger sinus infection.  She is in the process of getting new insurance so she is not currently allergy injections.  She does have significant allergies and stating has been on these in the past.  She reports compliance with her over-the-counter medication.  Denies any recent antibiotics.  Denies any known sick contacts.  She is confident she is not pregnant as her status post hysterectomy.    Past Medical History:  Diagnosis Date   Anxiety    Asthma    Eczema    GERD (gastroesophageal reflux disease)    Hiatal hernia    High cholesterol    Migraines     There are no active problems to display for this patient.   Past Surgical History:  Procedure Laterality Date   ABDOMINAL HYSTERECTOMY     CHOLECYSTECTOMY     TONSILLECTOMY      OB History   No obstetric history on file.      Home Medications    Prior to Admission medications   Medication Sig Start Date End Date Taking? Authorizing Provider  fluconazole (DIFLUCAN) 150 MG tablet Take 1 tablet (150 mg total) by mouth once for 1 dose. 05/02/24 05/02/24 Yes Wynette Jersey K, PA-C  pregabalin (LYRICA) 50 MG capsule Take 50 mg by mouth. 01/29/24  Yes [provider]  topiramate (TOPAMAX) 25 MG tablet Take by mouth. 01/08/24  Yes [provider]  ALBUTEROL  SULFATE HFA IN Inhale into the lungs.    [provider]  amoxicillin -clavulanate (AUGMENTIN )  875-125 MG tablet Take 1 tablet by mouth 2 (two) times daily. 05/02/24   Shyheim Tanney K, PA-C  citalopram (CELEXA) 10 MG tablet Take 10 mg by mouth daily.   Yes [provider]  docusate sodium  (COLACE) 100 MG capsule Take 1 capsule (100 mg total) by mouth every 12 (twelve) hours. 08/09/15   Letcher Rattler, PA-C  Esomeprazole Magnesium (NEXIUM PO) Take by mouth.   Yes [provider]  Fexofenadine HCl (ALLEGRA PO) Take by mouth.    [provider]  LORazepam  (ATIVAN ) 1 MG tablet Take 1 mg by mouth 2 (two) times daily.    [provider]  norgestrel-ethinyl estradiol (LO/OVRAL,CRYSELLE) 0.3-30 MG-MCG tablet Take 1 tablet by mouth daily.    [provider]  polyethylene glycol powder (GLYCOLAX /MIRALAX ) powder Take 17 g by mouth daily. 08/09/15   Letcher Rattler, PA-C  PRAVASTATIN SODIUM PO Take by mouth.    [provider]  rosuvastatin (CRESTOR) 20 MG tablet Take 20 mg by mouth at bedtime.   Yes [provider]  SUMATRIPTAN SUCCINATE PO Take by mouth.    [provider]  triamcinolone  (NASACORT ) 55 MCG/ACT AERO nasal inhaler Place 2 sprays into the nose daily. 05/01/17   Gigi Kyle, NP  acetaZOLAMIDE (DIAMOX) 250 MG tablet Take 250 mg by mouth 2 (two) times daily.  02/26/16  [provider]  fluticasone (FLONASE) 50 MCG/ACT  nasal spray Place 2 sprays into the nose daily.  02/26/16  [provider]    Family History History reviewed. No pertinent family history.  Social History Social History   Tobacco Use   Smoking status: Some Days    Types: Cigarettes   Smokeless tobacco: Never  Vaping Use   Vaping status: Never Used  Substance Use Topics   Alcohol use: Yes    Comment: occ   Drug use: No     Allergies   Egg-derived products, Pneumococcal vaccine, Almond oil, Dust mite extract, Gramineae pollens, and Oat grain extract allergy skin test   Review of Systems Review of Systems  Constitutional:   Positive for activity change. Negative for appetite change, fatigue and fever.  HENT:  Positive for congestion, postnasal drip, sinus pressure and sore throat. Negative for sneezing.   Respiratory:  Positive for cough. Negative for shortness of breath.   Cardiovascular:  Negative for chest pain.  Gastrointestinal:  Negative for abdominal pain, diarrhea, nausea and vomiting.  Neurological:  Positive for headaches. Negative for dizziness and light-headedness.     Physical Exam Triage Vital Signs ED Triage Vitals  Encounter Vitals Group     BP 05/02/24 1938 116/83     Systolic BP Percentile --      Diastolic BP Percentile --      Pulse Rate 05/02/24 1938 70     Resp 05/02/24 1938 18     Temp 05/02/24 1938 98.7 F (37.1 C)     Temp Source 05/02/24 1938 Oral     SpO2 05/02/24 1938 96 %     Weight --      Height --      Head Circumference --      Peak Flow --      Pain Score 05/02/24 1934 10     Pain Loc --      Pain Education --      Exclude from Growth Chart --    No data found.  Updated Vital Signs BP 116/83 (BP Location: Left Arm)   Pulse 70   Temp 98.7 F (37.1 C) (Oral)   Resp 18   LMP 04/10/2017   SpO2 96%   Visual Acuity Right Eye Distance:   Left Eye Distance:   Bilateral Distance:    Right Eye Near:   Left Eye Near:    Bilateral Near:     Physical Exam Vitals reviewed.  Constitutional:      General: She is awake. She is not in acute distress.    Appearance: Normal appearance. She is well-developed. She is not ill-appearing.     Comments: Very pleasant female appears stated age in no acute distress sitting comfortably in exam room  HENT:     Head: Normocephalic and atraumatic.     Right Ear: Tympanic membrane, ear canal and external ear normal. Tympanic membrane is not erythematous or bulging.     Left Ear: Tympanic membrane, ear canal and external ear normal. Tympanic membrane is not erythematous or bulging.     Nose:     Right Sinus: Maxillary sinus  tenderness and frontal sinus tenderness present.     Left Sinus: Maxillary sinus tenderness and frontal sinus tenderness present.     Mouth/Throat:     Pharynx: Uvula midline. Postnasal drip present. No oropharyngeal exudate or posterior oropharyngeal erythema.  Cardiovascular:     Rate and Rhythm: Normal rate and regular rhythm.     Heart sounds: Normal heart sounds, S1 normal  and S2 normal. No murmur heard. Pulmonary:     Effort: Pulmonary effort is normal.     Breath sounds: Normal breath sounds. No wheezing, rhonchi or rales.     Comments: Clear to auscultation bilaterally Psychiatric:        Behavior: Behavior is cooperative.      UC Treatments / Results  Labs (all labs ordered are listed, but only abnormal results are displayed) Labs Reviewed - No data to display  EKG   Radiology No results found.  Procedures Procedures (including critical care time)  Medications Ordered in UC Medications - No data to display  Initial Impression / Assessment and Plan / UC Course  I have reviewed the triage vital signs and the nursing notes.  Pertinent labs & imaging results that were available during my care of the patient were reviewed by me and considered in my medical decision making (see chart for details).     Patient is well-appearing, afebrile, nontoxic, nontachycardic.  No indication for viral testing as patient has been symptomatic for over 5 days this would not change management.  Given she has worsening symptoms we will cover for secondary bacterial faction with Augmentin  twice daily for 7 days.  She was encouraged use over-the-counter medication for symptom management.  Reports often getting yeast infection with antibiotic use so was given 1 dose of Diflucan.  We discussed that if she is not feeling better within a week or if anything worsens she needs to be seen immediately.  Strict return precautions given.  Excuse note declined.  Final Clinical Impressions(s) / UC  Diagnoses   Final diagnoses:  Acute non-recurrent pansinusitis  Antibiotic-induced yeast infection     Discharge Instructions      We are treating you for a sinus infection.  Start Augmentin  twice daily for 7 days.  Use over-the-counter medication for symptom management.  Take Diflucan if you need to if you develop any yeast infection symptoms.  Make sure you rest and drink plenty of fluid.  If you are not feeling better within a week or if anything worsens and you have high fever, worsening cough, shortness of breath you need to be seen immediately.  ED Prescriptions     Medication Sig Dispense Auth. Provider   amoxicillin -clavulanate (AUGMENTIN ) 875-125 MG tablet Take 1 tablet by mouth 2 (two) times daily. 14 tablet Imogene Gravelle K, PA-C   fluconazole (DIFLUCAN) 150 MG tablet Take 1 tablet (150 mg total) by mouth once for 1 dose. 1 tablet Sydnie Sigmund K, PA-C      PDMP not reviewed this encounter.   Budd Cargo, PA-C 05/02/24 2028

## 2024-05-02 NOTE — Discharge Instructions (Signed)
 We are treating you for a sinus infection.  Start Augmentin  twice daily for 7 days.  Use over-the-counter medication for symptom management.  Take Diflucan if you need to if you develop any yeast infection symptoms.  Make sure you rest and drink plenty of fluid.  If you are not feeling better within a week or if anything worsens and you have high fever, worsening cough, shortness of breath you need to be seen immediately.

## 2024-06-19 ENCOUNTER — Other Ambulatory Visit: Payer: Self-pay

## 2024-06-19 ENCOUNTER — Other Ambulatory Visit: Payer: Self-pay | Admitting: Allergy & Immunology

## 2024-06-19 ENCOUNTER — Encounter: Payer: Self-pay | Admitting: Allergy & Immunology

## 2024-06-19 ENCOUNTER — Ambulatory Visit: Payer: Self-pay | Admitting: Allergy & Immunology

## 2024-06-19 VITALS — BP 126/88 | HR 98 | Temp 98.1°F | Resp 18 | Ht 68.7 in | Wt 330.2 lb

## 2024-06-19 DIAGNOSIS — J310 Chronic rhinitis: Secondary | ICD-10-CM | POA: Diagnosis not present

## 2024-06-19 DIAGNOSIS — T7800XD Anaphylactic reaction due to unspecified food, subsequent encounter: Secondary | ICD-10-CM | POA: Diagnosis not present

## 2024-06-19 DIAGNOSIS — T7800XA Anaphylactic reaction due to unspecified food, initial encounter: Secondary | ICD-10-CM

## 2024-06-19 DIAGNOSIS — R5383 Other fatigue: Secondary | ICD-10-CM

## 2024-06-19 DIAGNOSIS — J4520 Mild intermittent asthma, uncomplicated: Secondary | ICD-10-CM

## 2024-06-19 MED ORDER — ALBUTEROL SULFATE HFA 108 (90 BASE) MCG/ACT IN AERS
2.0000 | INHALATION_SPRAY | RESPIRATORY_TRACT | 1 refills | Status: DC | PRN
Start: 2024-06-19 — End: 2024-06-25

## 2024-06-19 NOTE — Progress Notes (Deleted)
 NEW PATIENT  Date of Service/Encounter:  06/19/24  Consult requested by: Pcp, No   Assessment:   No diagnosis found.  Plan/Recommendations:   There are no Patient Instructions on file for this visit.   {Blank single:19197::This note in its entirety was forwarded to the Provider who requested this consultation.}  Subjective:   Laelynn Blizzard is a 38 y.o. female presenting today for evaluation of  Chief Complaint  Patient presents with  . Allergic Rhinitis     Post nasal drip, itching, has been tested several times before.   . Immunotherapy    She is interested in doing [facility].  [facility] Asthma    Say she has coughing and coating in her throat.     Cornisha Zetino has a history of the following: There are no active problems to display for this patient.   History obtained from: chart review and {Persons; PED relatives w/patient:19415::patient}.  Discussed the use of AI scribe software for clinical note transcription with the patient and/or guardian, who gave verbal consent to proceed.  Hebe Merriwether was referred by Pcp, No.     Gwenna is a 38 y.o. female presenting for {Blank single:19197::a food challenge,a drug challenge,skin testing,a sick visit,an evaluation of ***,a follow up visit}.    Asthma/Respiratory Symptom History: ***  Allergic Rhinitis Symptom History: ***  Food Allergy Symptom History: ***  Skin Symptom History: ***  GERD Symptom History: ***  Infection Symptom History: ***  ***Otherwise, there is no history of other atopic diseases, including {Blank multiple:19196:o:asthma,food allergies,drug allergies,environmental allergies,stinging insect allergies,eczema,urticaria,contact dermatitis}. There is no significant infectious history. ***Vaccinations are up to date.    Past Medical History: There are no active problems to display for this patient.   Medication List:  Allergies as of 06/19/2024        Reactions   Almond (diagnostic) Anaphylaxis   Egg-derived Products Anaphylaxis   Oat Grain Extract Allergy Skin Test Anaphylaxis   Dust Mite Extract Itching, Cough   Pneumococcal Vaccine Swelling   Swelling and pain at the injection site   Gramineae Pollens Itching        Medication List        Accurate as of June 19, 2024 10:51 AM. If you have any questions, ask your nurse or doctor.          STOP taking these medications    ALLEGRA PO Stopped by: Marty Morton Shaggy   amoxicillin -clavulanate 875-125 MG tablet Commonly known as: Augmentin  Stopped by: Kimori Tartaglia Louis Donavin Audino   docusate sodium  100 MG capsule Commonly known as: COLACE Stopped by: Denielle Bayard Louis Kit Brubacher   LORazepam  1 MG tablet Commonly known as: ATIVAN  Stopped by: Drako Maese Louis Saul Fabiano   norgestrel-ethinyl estradiol 0.3-30 MG-MCG tablet Commonly known as: LO/OVRAL Stopped by: Tydus Sanmiguel Louis Kea Callan   polyethylene glycol powder 17 GM/SCOOP powder Commonly known as: GLYCOLAX /MIRALAX  Stopped by: Marty Morton Shaggy   PRAVASTATIN SODIUM PO Stopped by: Marty Morton Shaggy   topiramate 25 MG tablet Commonly known as: TOPAMAX Stopped by: Erynn Vaca Louis Brittley Regner   triamcinolone  55 MCG/ACT Aero nasal inhaler Commonly known as: NASACORT  Stopped by: Marty Morton Shaggy       TAKE these medications    ALBUTEROL  SULFATE HFA IN Inhale into the lungs.   citalopram 10 MG tablet Commonly known as: CELEXA Take 10 mg by mouth daily.   NEXIUM PO Take by mouth.   pregabalin 50 MG capsule Commonly known as: LYRICA Take 50 mg by mouth.   rosuvastatin 20 MG tablet Commonly  known as: CRESTOR Take 20 mg by mouth at bedtime.   SUMATRIPTAN SUCCINATE PO Take by mouth.        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 She has required no {Blank  multiple:19196:a:speech therapy,occupational therapy,physical therapy}. ***non-contributory  Past Surgical History: Past Surgical History:  Procedure Laterality Date  . ABDOMINAL HYSTERECTOMY    . CHOLECYSTECTOMY    . TONSILLECTOMY       Family History: No family history on file.   Social History: Taylr lives at home with ***. She works for [employer]    Review of systems otherwise negative other than that mentioned in the HPI.    Objective:   Last menstrual period 04/10/2017. There is no height or weight on file to calculate BMI.     Physical Exam   Diagnostic studies:    Spirometry: results normal (FEV1: 3.06/103%, FVC: 3.70/102%, FEV1/FVC: 83%).    Spirometry consistent with normal pattern.   Allergy Studies: {Blank single:19197::none,deferred due to recent antihistamine use,deferred due to insurance stipulations that require a separate visit for testing,labs sent instead, }    {Blank single:19197::Allergy testing results were read and interpreted by myself, documented by clinical staff., }         Marty Shaggy, MD [facility] 

## 2024-06-19 NOTE — Patient Instructions (Addendum)
 1. Chronic rhinitis - Because of insurance stipulations, we cannot do skin testing on the same day as your first visit. - We are all working to fight this, but for now we need to do two separate visits.  - We will know more after we do testing at the next visit.  - The skin testing visit can be squeezed in at your convenience.  - Then we can make a more full plan to address all of your symptoms. - Be sure to stop your antihistamines for 3 days before this appointment.  - We are going to start Ryaltris one spray per nostril twice daily (this is sent by a specialty pharmacy called  [facility] out of [place]).  - They will call you to arrange shipment of this.   2. Mild intermittent asthma, uncomplicated - Lung testing looks excellent today. - I do not think that we need a controller medication at this point. - Daily controller medication(s): NONE - Prior to physical activity: albuterol  2 puffs 10-15 minutes before physical activity. - Rescue medications: albuterol  4 puffs every 4-6 hours as needed - Asthma control goals:  * Full participation in all desired activities (may need albuterol  before activity) * Albuterol  use two time or less a week on average (not counting use with activity) * Cough interfering with sleep two time or less a month * Oral steroids no more than once a year * No hospitalizations  3. Allergy with anaphylaxis due to food - We will do selected testing to foods (oat, almond, egg). - If you are eating a food without a problem, we typically do not test for it.   4. Fatigue - Check on that sleep study results.   5. Return in about 8 days (around 06/27/2024) for SKIN TESTING. You can have the follow up appointment with Dr. Iva or a Nurse Practicioner (our Nurse Practitioners are excellent and always have Physician oversight!).    Please inform us  of any Emergency Department visits, hospitalizations, or changes in symptoms. Call us  before going to the ED  for breathing or allergy symptoms since we might be able to fit you in for a sick visit. Feel free to contact us  anytime with any questions, problems, or concerns.  It was a pleasure to meet you today!  Websites that have reliable patient information: 1. American Academy of Asthma, Allergy, and Immunology: www.aaaai.org 2. Food Allergy Research and Education (FARE): foodallergy.org 3. Mothers of Asthmatics: http://www.asthmacommunitynetwork.org 4. American College of Allergy, Asthma, and Immunology: www.acaai.org      "Like" us  on Facebook and Instagram for our latest updates!      A healthy democracy works best when [employer] participate! Make sure you are registered to vote! If you have moved or changed any of your contact information, you will need to get this updated before voting! Scan the QR codes below to learn more!

## 2024-06-19 NOTE — Progress Notes (Signed)
 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 I suspect that she has both seasonal and perennial allergic rhinitis. Her spirometry appears great and without much use of albuterol , I have low suspicion for asthma. She has some night time awakenings with SOB, but no wheezing or exertional SOB making OSA more likely +/- the contribution of chronic rhinitis. She had a sleep study with [facility] a few months ago and is going to call about obtaining the results. I advised for an oral antihistamine, single or double medicated nasal spray (ryaltris depending on insurance), as well as returning in 1-2 weeks for allergy testing for panel 1 and egg, oat, almonds and plan for rapid desensitization immunotherapy.  Plan/Recommendations:   1. Chronic rhinitis - Because of insurance stipulations, we cannot do skin testing on the same day as your first visit. - We are all working to fight this, but for now we need to do two separate visits.  - We will know more after we do testing at the next visit.  - The skin testing visit can be squeezed in at your convenience.  - Then we can make a more full plan to address all of your symptoms. - Be sure to stop your antihistamines for 3 days before this appointment.  - We are going to start Ryaltris one spray per nostril twice daily (this is sent by a specialty pharmacy called  [facility] out of [place]).  - They will call you to  arrange shipment of this.   2. Mild intermittent asthma, uncomplicated - Lung testing looks excellent today. - I do not think that we need a controller medication at this point. - Daily controller medication(s): NONE - Prior to physical activity: albuterol  2 puffs 10-15 minutes before physical activity. - Rescue medications: albuterol  4 puffs every 4-6 hours as needed - Asthma control goals:  * Full participation in all desired activities (may need albuterol  before activity) * Albuterol  use two time or less a week on average (not counting use with activity) * Cough interfering with sleep two time or less a month * Oral steroids no more than once a year * No hospitalizations  3. Allergy with anaphylaxis due to food - We will do selected testing to foods (oat, almond, egg). - If you are eating a food without a problem, we typically do not test for it.   4. Fatigue - Check on that sleep study results.   5. Return in about 8 days (around 06/27/2024) for SKIN TESTING. You can have the follow up appointment with Dr. Iva or a Nurse Practicioner (our Nurse Practitioners are excellent and always have Physician oversight!).    Subjective:   Johnnie Moten is a 38 y.o. female presenting today for evaluation of  Chief Complaint  Patient presents with   Allergic Rhinitis     Post nasal drip, itching, has been tested several times before.    Immunotherapy    She is interested in doing  [facility].   Asthma    Say she has coughing and coating in her throat.     Sila Sarsfield has a history of the following: There are no active problems to display for this patient.   History obtained from: chart review and patient.  Discussed the use of AI scribe software for clinical note transcription with the patient and/or guardian, who gave verbal consent to proceed.  Aithana Kushner was referred by Pcp, No.     Rosemae is a 37 y.o. female presenting for an evaluation of rhinitis,  environmental and food allergies.  The patient says that for many years she has had body itching, nasal congestion, rhinorrhea, post-nasal drainage, malodorous breath, sneezing, watery eyes with burning and itching as well as a cough that produces mucus in the morning. About 4 years ago in [place], [place] she was tested for allergies which was positive for many things including pollen, grasses, molds, dogs, cats, and dust mites. For this she was recommended to undergo SCIT, but declined and would like to start it. She feels that her symptoms have worsened. The patient describes that she has these symptoms year round, but if she goes near fresh cut grass or even hugs an animal owner she will break out in itching and have a worsening of her nasal and eye symptoms. She uses a saline nasal spray, but nothing else and does not take an oral antihistamine. The patient was prescribed albuterol  (Ventolin ), but rarely has breathing complaints or wheezing and last used her albuterol  about 6 weeks ago.  Temprance notes that due to infrequent night time wakeups with short lasting shortness of breath, she did a sleep study around March in [facility] with [facility], but does not recall the results of this. She says that her breathing is noisier at night and that she does not ever feel well rested in the morning.  She also complains of diffuse skin itching, throat swelling, and some abdominal pain following foods such as almonds, eggs, and oats. This has never warranted treatment for her and she is able to tolerate about 2 eggs, but no more. She smokes about 1 pack per week. The patient's surgical history includes tonsillo/adenoidectomy.   Otherwise, there is no history of other atopic diseases, including drug allergies, stinging insect allergies, eczema, or contact dermatitis. There is no significant infectious history. Vaccinations are up to date.    Past Medical History: There are no active problems to display for this  patient.   Medication List:  Allergies as of 06/19/2024       Reactions   Almond (diagnostic) Anaphylaxis   Egg-derived Products Anaphylaxis   Oat Grain Extract Allergy Skin Test Anaphylaxis   Dust Mite Extract Itching, Cough   Pneumococcal Vaccine Swelling   Swelling and pain at the injection site   Gramineae Pollens Itching        Medication List        Accurate as of June 19, 2024 12:23 PM. If you have any questions, ask your nurse or doctor.          STOP taking these medications    ALLEGRA PO Stopped by: Marty Morton Shaggy   amoxicillin -clavulanate 875-125 MG tablet Commonly known as: Augmentin  Stopped by: Marty Morton Shaggy   docusate sodium  100 MG capsule Commonly known as: COLACE Stopped by: Ramandeep Arington Louis Yordan Martindale   LORazepam  1 MG tablet Commonly known as: ATIVAN  Stopped by: Erin Obando Louis Cohen Doleman   norgestrel-ethinyl estradiol 0.3-30 MG-MCG tablet Commonly known as: LO/OVRAL  Stopped by: Marty Morton Shaggy   polyethylene glycol powder 17 GM/SCOOP powder Commonly known as: GLYCOLAX /MIRALAX  Stopped by: Marty Morton Shaggy   PRAVASTATIN SODIUM PO Stopped by: Marty Morton Shaggy   topiramate 25 MG tablet Commonly known as: TOPAMAX Stopped by: Pablo Mathurin Louis Leona Alen   triamcinolone  55 MCG/ACT Aero nasal inhaler Commonly known as: NASACORT  Stopped by: Marty Morton Shaggy       TAKE these medications    ALBUTEROL  SULFATE HFA IN Inhale into the lungs.   citalopram 10 MG tablet Commonly known as: CELEXA Take 10 mg by mouth daily.   NEXIUM PO Take by mouth.   pregabalin 50 MG capsule Commonly known as: LYRICA Take 50 mg by mouth.   rosuvastatin 20 MG tablet Commonly known as: CRESTOR Take 20 mg by mouth at bedtime.   SUMATRIPTAN SUCCINATE PO Take by mouth.        Birth History: non-contributory  Developmental History:  non-contributory  Past Surgical History: Past Surgical History:  Procedure Laterality Date    ABDOMINAL HYSTERECTOMY     CHOLECYSTECTOMY     TONSILLECTOMY       Family History: Family History  Problem Relation Age of Onset   Eczema Sister    Allergic rhinitis Sister      Social History: Sydelle lives at home in a house.  There is carpeting throughout the home.  They have electric heating and central cooling.  There are no animals inside or outside of the home.  There are no dust mite covers in the bedding.  There is no tobacco exposure.  She currently works as a Therapist, music.  There is no fume, chemical, or dust exposure.  There is no tobacco exposure.  She  does smoke cigarettes since 2005.   Review of systems otherwise negative other than that mentioned in the HPI.    Objective:   Blood pressure 126/88, pulse 98, temperature 98.1 F (36.7 C), temperature source Temporal, resp. rate 18, height 5' 8.7 (1.745 m), weight (!) 330 lb 3.2 oz (149.8 kg), last menstrual period 04/10/2017, SpO2 96%. Body mass index is 49.19 kg/m.     Physical Exam Constitutional:      Appearance: Normal appearance.  HENT:     Head: Normocephalic.     Right Ear: Tympanic membrane normal.     Left Ear: Tympanic membrane normal.     Nose: Nose normal.     Comments: Turbinates appear hyperemic bilaterally with mild hypertrophy    Mouth/Throat:     Mouth: Mucous membranes are moist.     Comments: Hard plaque to anterior lower dentition. Tonsils not present. No cobblestoning of the posterior oropharynx Cardiovascular:     Rate and Rhythm: Normal rate and regular rhythm.  Pulmonary:     Effort: Pulmonary effort is normal.     Breath sounds: Normal breath sounds.  Musculoskeletal:        General: Normal range of motion.     Cervical back: Normal range of motion.  Skin:    General: Skin is warm.  Neurological:     General: No focal deficit present.     Mental Status: She is alert and oriented to person, place, and time.     Gait: Gait normal.   Psychiatric:        Mood and Affect: Mood normal.        Behavior: Behavior normal.      Diagnostic studies: None  Spirometry: results normal (FEV1: 3.06L @ 103%,  FVC: 101%, FEV1/FVC: .83%).    Spirometry consistent with normal pattern.   Allergy Studies: deferred due to insurance stipulations that require a separate visit for testing    Donnice Mutter, MS4 [facility]  Marty Shaggy, MD [facility] 

## 2024-06-20 ENCOUNTER — Telehealth: Payer: Self-pay

## 2024-06-20 ENCOUNTER — Other Ambulatory Visit (HOSPITAL_COMMUNITY): Payer: Self-pay

## 2024-06-20 NOTE — Telephone Encounter (Signed)
 Insurance may require alternative size such as the 8.5gm albuterol  hfa. Unable to do benefits investigation as the patients insurance is not contracted with [facility].

## 2024-06-25 NOTE — Telephone Encounter (Signed)
 Xopenex preferred except NDCs  [id number], [id number]

## 2024-06-26 NOTE — Patient Instructions (Incomplete)
 Allergic rhinitis Your skin testing  Asthma Continue albuterol  2 puffs once every 4 hours if needed for cough or wheeze You may use albuterol  2 puffs 5 to 15 minutes before activity to decrease cough or wheeze  Food allergy Your skin testing Oat, almond, and egg  Call the clinic if this treatment plan is not working well for you.  Follow up in *** or sooner if needed.

## 2024-06-26 NOTE — Progress Notes (Unsigned)
   [address] [place] [address] Dept: [phone]  FOLLOW UP NOTE  Patient ID: Nanette Rout, female    DOB: June 20, 1986  Age: 38 y.o. MRN: [id number] Date of Office Visit: 06/27/2024  Assessment  Chief Complaint: No chief complaint on file.  HPI Ceria Suminski is a 38 year old female who presents to the clinic for follow-up visit with allergy skin testing.  She was last seen in this clinic on 06/19/2024 by Dr. Iva for evaluation of chronic rhinitis, asthma, and possible food allergy to oat, almond, and egg. Discussed the use of AI scribe software for clinical note transcription with the patient, who gave verbal consent to proceed.  History of Present Illness      Drug Allergies:  Allergies  Allergen Reactions   Almond (Diagnostic) Anaphylaxis   Egg-Derived Products Anaphylaxis   Oat Grain Extract Allergy Skin Test Anaphylaxis   Dust Mite Extract Itching and Cough   Pneumococcal Vaccine Swelling    Swelling and pain at the injection site   Gramineae Pollens Itching    Physical Exam: LMP 04/10/2017    Physical Exam  Diagnostics:    Assessment and Plan: No diagnosis found.  No orders of the defined types were placed in this encounter.   There are no Patient Instructions on file for this visit.  No follow-ups on file.    Thank you for the opportunity to care for this patient.  Please do not hesitate to contact me with questions.  Arlean Mutter, FNP [facility]

## 2024-06-27 ENCOUNTER — Encounter: Payer: Self-pay | Admitting: Family Medicine

## 2024-06-27 ENCOUNTER — Ambulatory Visit (INDEPENDENT_AMBULATORY_CARE_PROVIDER_SITE_OTHER): Admitting: Family Medicine

## 2024-06-27 DIAGNOSIS — T7800XD Anaphylactic reaction due to unspecified food, subsequent encounter: Secondary | ICD-10-CM

## 2024-06-27 DIAGNOSIS — J3089 Other allergic rhinitis: Secondary | ICD-10-CM | POA: Diagnosis not present

## 2024-06-27 DIAGNOSIS — T7800XA Anaphylactic reaction due to unspecified food, initial encounter: Secondary | ICD-10-CM | POA: Insufficient documentation

## 2024-06-30 LAB — ALLERGENS, ZONE 2

## 2024-06-30 LAB — EGG COMPONENT PANEL
F232-IgE Ovalbumin: <0.1 kU/L
F233-IgE Ovomucoid: <0.1 kU/L

## 2024-06-30 LAB — F245-IGE EGG, WHOLE: Egg, Whole IgE: <0.1 kU/L

## 2024-06-30 LAB — F020-IGE ALMOND: F020-IgE Almond: <0.1 kU/L

## 2024-06-30 LAB — ALLERGEN,OAT,F7: Allergen Oat IgE: <0.1 kU/L

## 2024-07-02 ENCOUNTER — Ambulatory Visit: Payer: Self-pay | Admitting: Family Medicine

## 2024-07-02 NOTE — Progress Notes (Signed)
 Can you please let this patient know that her environmental allergy  testing was negative. Labs for egg, oat, and almond were also negative. Please have her continue to avoid these foods and have acces to an EpiPen set/. She can come in for food challenges to these foods, however they must be completed on separate days.  Stop antihistamines for 3 days before food challenge if interested. Thank you

## 2024-07-04 ENCOUNTER — Telehealth: Payer: Self-pay | Admitting: Allergy & Immunology

## 2024-07-04 MED ORDER — RYALTRIS 665-25 MCG/ACT NA SUSP: 1.0000 | Freq: Two times a day (BID) | NASAL | 5 refills | Status: AC

## 2024-07-04 NOTE — Telephone Encounter (Signed)
 Pt informed of Ryaltris being sent. Explained lab results as well.

## 2024-07-04 NOTE — Telephone Encounter (Signed)
 Rita Dickson called and stated that at her appt. on 7/17 a nasal spray was discussed to be prescribed. She was unsure of the name, and I do not see it in her chart. She stated she did not get the prescription and would like to have it sent to the [facility] on [address] In [place].

## 2024-09-24 ENCOUNTER — Ambulatory Visit: Admitting: Family Medicine
